# Patient Record
Sex: Female | Born: 1980 | Race: White | Hispanic: No | Marital: Single | Smoking: Current every day smoker
Health system: Southern US, Community
[De-identification: ages and names within clinical notes are randomized; demographics above are authoritative.]

## PROBLEM LIST (undated history)

## (undated) DIAGNOSIS — F419 Anxiety disorder, unspecified: Secondary | ICD-10-CM

## (undated) DIAGNOSIS — T7840XA Allergy, unspecified, initial encounter: Secondary | ICD-10-CM

## (undated) DIAGNOSIS — R569 Unspecified convulsions: Secondary | ICD-10-CM

## (undated) DIAGNOSIS — F1910 Other psychoactive substance abuse, uncomplicated: Secondary | ICD-10-CM

## (undated) DIAGNOSIS — G8929 Other chronic pain: Secondary | ICD-10-CM

## (undated) DIAGNOSIS — N3010 Interstitial cystitis (chronic) without hematuria: Secondary | ICD-10-CM

## (undated) DIAGNOSIS — F329 Major depressive disorder, single episode, unspecified: Secondary | ICD-10-CM

## (undated) DIAGNOSIS — F32A Depression, unspecified: Secondary | ICD-10-CM

## (undated) HISTORY — PX: BLADDER SURGERY: SHX569

## (undated) HISTORY — DX: Other psychoactive substance abuse, uncomplicated: F19.10

## (undated) HISTORY — PX: OTHER SURGICAL HISTORY: SHX169

## (undated) HISTORY — DX: Depression, unspecified: F32.A

## (undated) HISTORY — DX: Allergy, unspecified, initial encounter: T78.40XA

## (undated) HISTORY — DX: Anxiety disorder, unspecified: F41.9

## (undated) HISTORY — PX: CHOLECYSTECTOMY: SHX55

## (undated) HISTORY — DX: Unspecified convulsions: R56.9

## (undated) HISTORY — PX: TONSILLECTOMY: SUR1361

## (undated) HISTORY — DX: Major depressive disorder, single episode, unspecified: F32.9

---
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
## 2000-03-31 ENCOUNTER — Other Ambulatory Visit: Admission: RE | Admit: 2000-03-31 | Discharge: 2000-03-31 | Payer: Self-pay | Admitting: Gynecology

## 2001-04-21 ENCOUNTER — Emergency Department (HOSPITAL_COMMUNITY): Admission: EM | Admit: 2001-04-21 | Discharge: 2001-04-21 | Payer: Self-pay | Admitting: *Deleted

## 2001-07-07 ENCOUNTER — Emergency Department (HOSPITAL_COMMUNITY): Admission: EM | Admit: 2001-07-07 | Discharge: 2001-07-07 | Payer: Self-pay | Admitting: Emergency Medicine

## 2001-07-07 ENCOUNTER — Encounter: Payer: Self-pay | Admitting: Emergency Medicine

## 2002-10-25 ENCOUNTER — Emergency Department (HOSPITAL_COMMUNITY): Admission: EM | Admit: 2002-10-25 | Discharge: 2002-10-26 | Payer: Self-pay | Admitting: Emergency Medicine

## 2002-11-28 ENCOUNTER — Encounter (INDEPENDENT_AMBULATORY_CARE_PROVIDER_SITE_OTHER): Payer: Self-pay

## 2002-11-28 ENCOUNTER — Ambulatory Visit (HOSPITAL_BASED_OUTPATIENT_CLINIC_OR_DEPARTMENT_OTHER): Admission: RE | Admit: 2002-11-28 | Discharge: 2002-11-28 | Payer: Self-pay | Admitting: Urology

## 2003-07-20 ENCOUNTER — Emergency Department (HOSPITAL_COMMUNITY): Admission: EM | Admit: 2003-07-20 | Discharge: 2003-07-20 | Payer: Self-pay | Admitting: Emergency Medicine

## 2003-10-13 ENCOUNTER — Emergency Department (HOSPITAL_COMMUNITY): Admission: EM | Admit: 2003-10-13 | Discharge: 2003-10-14 | Payer: Self-pay | Admitting: Emergency Medicine

## 2003-12-04 ENCOUNTER — Ambulatory Visit (HOSPITAL_BASED_OUTPATIENT_CLINIC_OR_DEPARTMENT_OTHER): Admission: RE | Admit: 2003-12-04 | Discharge: 2003-12-04 | Payer: Self-pay | Admitting: Urology

## 2003-12-04 ENCOUNTER — Ambulatory Visit (HOSPITAL_COMMUNITY): Admission: RE | Admit: 2003-12-04 | Discharge: 2003-12-04 | Payer: Self-pay | Admitting: Urology

## 2004-04-24 ENCOUNTER — Encounter: Admission: RE | Admit: 2004-04-24 | Discharge: 2004-04-24 | Payer: Self-pay | Admitting: Internal Medicine

## 2004-07-24 ENCOUNTER — Emergency Department (HOSPITAL_COMMUNITY): Admission: EM | Admit: 2004-07-24 | Discharge: 2004-07-24 | Payer: Self-pay | Admitting: Emergency Medicine

## 2004-08-05 ENCOUNTER — Ambulatory Visit (HOSPITAL_COMMUNITY): Admission: RE | Admit: 2004-08-05 | Discharge: 2004-08-05 | Payer: Self-pay | Admitting: Urology

## 2004-08-05 ENCOUNTER — Ambulatory Visit (HOSPITAL_BASED_OUTPATIENT_CLINIC_OR_DEPARTMENT_OTHER): Admission: RE | Admit: 2004-08-05 | Discharge: 2004-08-05 | Payer: Self-pay | Admitting: Urology

## 2004-08-23 ENCOUNTER — Emergency Department (HOSPITAL_COMMUNITY): Admission: EM | Admit: 2004-08-23 | Discharge: 2004-08-23 | Payer: Self-pay | Admitting: Emergency Medicine

## 2005-07-20 ENCOUNTER — Other Ambulatory Visit: Admission: RE | Admit: 2005-07-20 | Discharge: 2005-07-20 | Payer: Self-pay | Admitting: Obstetrics and Gynecology

## 2005-10-27 ENCOUNTER — Ambulatory Visit (HOSPITAL_BASED_OUTPATIENT_CLINIC_OR_DEPARTMENT_OTHER): Admission: RE | Admit: 2005-10-27 | Discharge: 2005-10-27 | Payer: Self-pay | Admitting: Urology

## 2006-01-19 ENCOUNTER — Other Ambulatory Visit: Admission: RE | Admit: 2006-01-19 | Discharge: 2006-01-19 | Payer: Self-pay | Admitting: Obstetrics and Gynecology

## 2006-03-12 ENCOUNTER — Ambulatory Visit (HOSPITAL_BASED_OUTPATIENT_CLINIC_OR_DEPARTMENT_OTHER): Admission: RE | Admit: 2006-03-12 | Discharge: 2006-03-12 | Payer: Self-pay | Admitting: Urology

## 2006-08-20 ENCOUNTER — Ambulatory Visit (HOSPITAL_COMMUNITY): Admission: RE | Admit: 2006-08-20 | Discharge: 2006-08-20 | Payer: Self-pay | Admitting: Urology

## 2006-09-09 ENCOUNTER — Other Ambulatory Visit: Admission: RE | Admit: 2006-09-09 | Discharge: 2006-09-09 | Payer: Self-pay | Admitting: Obstetrics and Gynecology

## 2007-07-29 ENCOUNTER — Ambulatory Visit (HOSPITAL_BASED_OUTPATIENT_CLINIC_OR_DEPARTMENT_OTHER): Admission: RE | Admit: 2007-07-29 | Discharge: 2007-07-29 | Payer: Self-pay | Admitting: Urology

## 2007-09-28 ENCOUNTER — Other Ambulatory Visit: Admission: RE | Admit: 2007-09-28 | Discharge: 2007-09-28 | Payer: Self-pay | Admitting: Obstetrics and Gynecology

## 2008-05-07 ENCOUNTER — Other Ambulatory Visit: Admission: RE | Admit: 2008-05-07 | Discharge: 2008-05-07 | Payer: Self-pay | Admitting: Obstetrics and Gynecology

## 2008-10-03 ENCOUNTER — Other Ambulatory Visit: Admission: RE | Admit: 2008-10-03 | Discharge: 2008-10-03 | Payer: Self-pay | Admitting: Obstetrics and Gynecology

## 2009-09-05 ENCOUNTER — Encounter: Payer: Self-pay | Admitting: Infectious Disease

## 2009-09-09 ENCOUNTER — Encounter: Payer: Self-pay | Admitting: Infectious Disease

## 2009-09-11 ENCOUNTER — Ambulatory Visit: Payer: Self-pay | Admitting: Infectious Disease

## 2009-09-11 DIAGNOSIS — Z9189 Other specified personal risk factors, not elsewhere classified: Secondary | ICD-10-CM

## 2009-09-11 DIAGNOSIS — L52 Erythema nodosum: Secondary | ICD-10-CM

## 2009-09-11 DIAGNOSIS — Z8619 Personal history of other infectious and parasitic diseases: Secondary | ICD-10-CM

## 2009-09-11 DIAGNOSIS — F411 Generalized anxiety disorder: Secondary | ICD-10-CM | POA: Insufficient documentation

## 2009-09-11 DIAGNOSIS — L03119 Cellulitis of unspecified part of limb: Secondary | ICD-10-CM

## 2009-09-11 DIAGNOSIS — D7589 Other specified diseases of blood and blood-forming organs: Secondary | ICD-10-CM | POA: Insufficient documentation

## 2009-09-11 DIAGNOSIS — R42 Dizziness and giddiness: Secondary | ICD-10-CM | POA: Insufficient documentation

## 2009-09-11 DIAGNOSIS — F17200 Nicotine dependence, unspecified, uncomplicated: Secondary | ICD-10-CM

## 2009-09-11 DIAGNOSIS — L02419 Cutaneous abscess of limb, unspecified: Secondary | ICD-10-CM

## 2009-09-11 LAB — CONVERTED CEMR LAB
ALT: 13 units/L (ref 0–35)
AST: 16 units/L (ref 0–37)
Albumin: 4.3 g/dL (ref 3.5–5.2)
Alkaline Phosphatase: 63 units/L (ref 39–117)
Angiotensin 1 Converting Enzyme: 32 units/L (ref 9–67)
BUN: 19 mg/dL (ref 6–23)
Basophils Absolute: 0 10*3/uL (ref 0.0–0.1)
Basophils Relative: 0 % (ref 0–1)
CO2: 21 meq/L (ref 19–32)
CRP: 0.5 mg/dL (ref <0.6)
Calcium: 9.3 mg/dL (ref 8.4–10.5)
Chloride: 110 meq/L (ref 96–112)
Creatinine, Ser: 0.65 mg/dL (ref 0.40–1.20)
Crypto Ag: NEGATIVE
Eosinophils Absolute: 0.3 10*3/uL (ref 0.0–0.7)
Eosinophils Relative: 3 % (ref 0–5)
Ferritin: 27 ng/mL (ref 10–291)
Glucose, Bld: 85 mg/dL (ref 70–99)
HCT: 39.2 % (ref 36.0–46.0)
HIV 1 RNA Quant: <48 copies/mL (ref <48)
HIV-1 RNA Quant, Log: <1.68 (ref <1.68)
Hemoglobin: 13.5 g/dL (ref 12.0–15.0)
Lymphocytes Relative: 26 % (ref 12–46)
Lymphs Abs: 2.4 10*3/uL (ref 0.7–4.0)
MCHC: 34.3 g/dL (ref 30.0–36.0)
MCV: 101.7 fL — ABNORMAL HIGH (ref >=78.0)
Monocytes Absolute: 0.6 10*3/uL (ref 0.1–1.0)
Monocytes Relative: 6 % (ref 3–12)
Neutro Abs: 6 10*3/uL (ref 1.7–7.7)
Neutrophils Relative %: 65 % (ref 43–77)
Platelets: 236 10*3/uL (ref 150–400)
Potassium: 4 meq/L (ref 3.5–5.3)
RBC: 3.86 M/uL — ABNORMAL LOW (ref 3.87–5.11)
RDW: 12.1 % (ref 11.5–15.5)
Sed Rate: 9 mm/hr (ref 0–22)
Sodium: 141 meq/L (ref 135–145)
Total Bilirubin: 0.3 mg/dL (ref 0.3–1.2)
Total Protein: 6.5 g/dL (ref 6.0–8.3)
WBC: 9.2 10*3/uL (ref 4.0–10.5)

## 2009-09-12 ENCOUNTER — Encounter: Payer: Self-pay | Admitting: Infectious Disease

## 2009-09-16 ENCOUNTER — Telehealth: Payer: Self-pay | Admitting: Infectious Disease

## 2009-09-16 DIAGNOSIS — D692 Other nonthrombocytopenic purpura: Secondary | ICD-10-CM | POA: Insufficient documentation

## 2009-09-19 ENCOUNTER — Other Ambulatory Visit: Admission: RE | Admit: 2009-09-19 | Discharge: 2009-09-19 | Payer: Self-pay | Admitting: Obstetrics and Gynecology

## 2009-09-24 ENCOUNTER — Encounter: Payer: Self-pay | Admitting: Infectious Disease

## 2010-08-05 NOTE — Progress Notes (Signed)
Summary: lab results request   Phone Note Call from Patient   Summary of Call: Calling for lab results.     Pt states her leg is worse.  Please advise. Tomasita Morrow RN  September 16, 2009 1:35 PM   Follow-up for Phone Call        I told her that it would take some time to get ALL of her labs back. EVery single lab I have back so fare is completely normal except for her having a mild macrocytosis on her cbc. I think a punch biopsy of her shin lesion, dermatology referral, or rheumatology referral are some reasonable options. Finally I could give her a prednisone course but I am interested at finding out what she shas if possible first. 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 She really nees to be seen by Rheumatoloigst and I have put in referral for one. [facility] with Azzie Roup and Alben Deeds are a good group. Dr. Kellie Simmering also sees folks for Rheumatology

## 2010-08-05 NOTE — Letter (Signed)
Summary: [facility]  [facility]   Imported By: Margie Billet 09/20/2009 15:16:54  _____________________________________________________________________  External Attachment:    Type:   Image     Comment:   External Document

## 2010-08-05 NOTE — Miscellaneous (Signed)
Summary: HIPAA Restrictions  HIPAA Restrictions   Imported By: Florinda Marker 09/12/2009 14:06:13  _____________________________________________________________________  External Attachment:    Type:   Image     Comment:   External Document

## 2010-08-05 NOTE — Consult Note (Signed)
Summary: [facility]  [facility]   Imported By: Florinda Marker 10/07/2009 16:13:49  _____________________________________________________________________  External Attachment:    Type:   Image     Comment:   External Document

## 2010-08-05 NOTE — Assessment & Plan Note (Signed)
Summary: new pt fever x 3 wks   Visit Type:  Consult Referring Provider:  Althea Charon Primary Provider:  None  CC:  New referral Dr Althea Charon    left lower leg infection with c/o    headaches and nausea and bodyaches X 1 week.  History of Present Illness: 30 year old Caucasian lady with pMHx signficant for interstial cystitis who was evalauted by Dr. Althea Charon on 3/311 for painful nodules that  had devloped on left lower shin. This first developed these She had symptoms of GI upset with fever two weeks prior to being seen by Dr. Althea Charon. She had labs drawn on the 3rd which included an ASO titer that was elevated to 795. Lyme titer was checked (not clear why since this is not an endemic area)Her ESR and CRP were normal. Her other labs were significant for macrocytosis. She was started on Penicillin VK 500mg  three times daily along with indomethacin. SHe was called on Friday and appears to have misunderstood Dr. Georgeanne Nim office who were trying to convey that her ASO titer was positive. HOwever what came across to her was that she had a "blood stream infection." Over the weekend she developed headaches, increasing anxiety, some nausea and had a measured temperature of 101 along with diffuse joint pains. She denies any history of pharyngitis in the preceding weeks and months or of cellulitis other than the e nodosum like leison on her ankle. She denies having many other fevers and has not lost weight. 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  Current Medications (verified): 1)  Penicillin V Potassium 500 Mg Tabs (Penicillin V Potassium) .... One Three Times A Day 2)  Indomethacin Cr 75 Mg Cr-Caps (Indomethacin) .... On Twice Daily 3)  Depo-Provera 150 Mg/ml Susp (Medroxyprogesterone Acetate) .... Once Every 3 Months 4)  Excedrin Migraine 250-250-65 Mg Tabs (Aspirin-Acetaminophen-Caffeine) .... Takes As Needed Migraines  Allergies (verified): 1)  !  * Tequin   Preventive Screening-Counseling & Management  Alcohol-Tobacco     Alcohol drinks/day: 0     Smoking Status: current     Packs/Day: 1.0  Caffeine-Diet-Exercise     Caffeine use/day: 0      Drug Use:  no.     Current Allergies (reviewed today): ! * TEQUIN Past History:  Past Medical History: Interstitial cystitis smoking  Past Surgical History: none  Family History: diabetes, chf, HTN  Social History: Occupation: Hydrologist at [employer] Current Smoker Alcohol use-yes Drug use-no Has only travelled to [facility] as far as travel outside of [place] Drug Use:  no  Review of Systems       The patient complains of fever, headaches, abdominal pain, and suspicious skin lesions.  The patient denies anorexia, weight loss, weight gain, vision loss, decreased hearing, hoarseness, chest pain, syncope, dyspnea on exertion, peripheral edema, prolonged cough, hemoptysis, melena, hematochezia, severe indigestion/heartburn, hematuria, incontinence, genital sores, muscle weakness, difficulty walking, depression, unusual weight change, abnormal bleeding, and enlarged lymph nodes.    Vital Signs:  Patient profile:   30 year old female Height:      62 inches Weight:      126.5 pounds BMI:     23.22 BSA:     1.57 Temp:     98.3 degrees F Pulse rate:   94 / minute BP sitting:   120 / 72  (right arm)  Vitals Entered By: Tomasita Morrow RN (September 11, 2009 2:40 PM)  Serial Vital Signs/Assessments:  Time      Position  BP       Pulse  Resp  Temp     By 3;25 pm             114/70   75                    Tomasita Morrow RN 3:34 PM             116/75   86                    Tammy King RN  CC: New referral Dr Althea Charon    left lower leg infection with c/o    headaches, nausea and bodyaches X 1 week Is Patient Diabetic? No Nutritional Status BMI of 19 -24 = normal Nutritional Status Detail normal  Have you ever been in a relationship where you felt threatened, hurt  or afraid?No  Domestic Violence Intervention none  Does patient need assistance? Functional Status Self care Ambulation Normal   Physical Exam  General:  alert, well-nourished, and well-hydrated.   Head:  normocephalic, atraumatic, and no abnormalities observed.   Eyes:  vision grossly intact, pupils equal, pupils round, pupils reactive to light, and no injection.   Ears:  no external deformities and ear piercing(s) noted.   Nose:  no external deformity and no external erythema.   Mouth:  good dentition, pharynx pink and moist, no erythema, and no exudates.   Neck:  supple and full ROM.   Lungs:  normal respiratory effort, no accessory muscle use, normal breath sounds, no crackles, and no wheezes.   Heart:  normal rate, regular rhythm, no murmur, no gallop, and no rub.   Abdomen:  soft, non-tender, normal bowel sounds, no distention, and no masses.   Msk:  normal ROM and no joint tenderness.   Extremities:  she has faintly erythemaotous raised area on her ankle that is exquisitely tender to palpation Neurologic:  alert & oriented X3, strength normal in all extremities, and gait normal.   Skin:  see extremities Psych:  Oriented X3, memory intact for recent and remote, normally interactive, and good eye contact.     Impression & Recommendations:  Problem # 1:  ERYTHEMA NODOSUM (ICD-695.2) I think she may indeed benefit from a biopsy. I dont think this is a sequlae of antecedent group a strep infection (she has no clinical evidence for this). I am not going to react to the isolated elevated ASO absent any evidence for Rheumatic fever, GNehritis etc. I will check ACE level, HIV ab, RNA, RPR, histoplasma ag, crypto ag, blasto abs, ACE, ANCA, ASCA, ferritin. I would recommend she also be seen by Rheumatology and Dermotology. She can finish out her course of PCN but I dont think she has evidence for poststreptococcal infection and I dont think her e nodosum is soft tissue infection but am ok  with her finishing this course of abx. Orders: T-Pan-ANCA (3167) T- * Misc. Laboratory test [id number]) T- * Misc. Laboratory test [phone]) T- * Misc. Laboratory test [phone]) T-Syphilis Test (RPR) [phone]) T- * Misc. Laboratory test [phone]) T- * Misc. Laboratory test [phone]) T- * Misc. Laboratory test [phone]) T-HIV Viral Load [phone]) T-Ferritin [phone]) Consultation Level IV (66063)  Problem # 2:  PERSONAL HX OTH INFECTIOUS&PARASITIC DISEASE (ICD-V12.09) I actually DO NOT FIND EVIDENCE FOR antecedent streptococal infection. It is not uncommon for many adolescents and young adults  to ahve ASO titers that are elevated and the meaning of this tier in isolateion is not cear at all. Orders: Consultation Level IV (60454)  Problem # 3:  OTHER SPEC DISEASES BLOOD&BLOOD-FORMING ORGANS (ICD-289.89) She has macrocytosis. I will check folate and b12 and tsh Orders: Consultation Level IV (09811)  Problem # 4:  DIZZINESS (ICD-780.4) I think this is largely caused by the anxiety of her perception that she was being told that she had a "bloodstream infection." Her orthostatics were negative. Orders: Consultation Level IV (91478)  Problem # 5:  CELLULITIS AND ABSCESS OF LEG EXCEPT FOOT (ICD-682.6) I actually am sketpical of this representing cellulitis due to the time course (see above) Her updated medication list for this problem includes:    Penicillin V Potassium 500 Mg Tabs (Penicillin v potassium) ..... One three times a day    Excedrin Migraine 250-250-65 Mg Tabs (Aspirin-acetaminophen-caffeine) .Marland Kitchen... Takes as needed migraines  Orders: Consultation Level IV (29562)  Problem # 6:  SMOKER (ICD-305.1) could not pin down relationship between her E nodosum and smoking which migh be revealing if this was a harbinger of IBD Orders: Consultation Level IV (13086)  Medications Added to Medication List This Visit: 1)  Penicillin V Potassium 500 Mg Tabs (Penicillin v potassium) ....  One three times a day 2)  Depo-provera 150 Mg/ml Susp (Medroxyprogesterone acetate) .... Once every 3 months 3)  Excedrin Migraine 250-250-65 Mg Tabs (Aspirin-acetaminophen-caffeine) .... Takes as needed migraines  Other Orders: T-CBC w/Diff ([id number]) T-Comprehensive Metabolic Panel [phone]) T-C-Reactive Protein [phone]) T-Sed Rate (Automated) [phone]) T-Culture, Blood Routine ([id number]) T-Culture, Blood Routine ([id number]) T-HIV Antibody  (Reflex) ([id number])  Patient Instructions: 1)  Make followup appt with Dr. Daiva Eves in 2-3 weeks Process Orders Check Orders Results:     [facility]: Order checked:       NOT AUTHORIZED TO ORDER Tests Sent for requisitioning (September 11, 2009 7:29 PM):     09/11/2009: [facility] -- T-CBC w/Diff [[id number]] (signed)     09/11/2009: [facility] -- T-Comprehensive Metabolic Panel [[id number]] (signed)     09/11/2009: [facility] -- T-C-Reactive Protein [phone] (signed)     09/11/2009: [facility] -- T-Sed Rate (Automated) [[id number]] (signed)     09/11/2009: [facility] -- T-Culture, Blood Routine [[id number]] (signed)     09/11/2009: [facility] -- T-Culture, Blood Routine [[id number]] (signed)     09/11/2009: [facility] -- T-HIV Antibody  (Reflex) [[id number]] (signed)     09/11/2009: [facility] -- T-Pan-ANCA [3167] (signed)     09/11/2009: [facility] -- T- * Misc. Laboratory test [phone] (signed)     09/11/2009: [facility] -- T- * Misc. Laboratory test [phone] (signed)     09/11/2009: [facility] -- T- * Misc. Laboratory test [99999] (signed)     09/11/2009: [facility] -- T-Syphilis Test (RPR) [phone] (signed)     09/11/2009: [facility] -- T- * Misc. Laboratory test [phone]  (signed)     09/11/2009: [facility] -- T- * Misc. Laboratory test [phone] (signed)     09/11/2009: [facility] -- T- * Misc. Laboratory test [99999] (signed)     09/11/2009: [facility] -- T-HIV Viral Load [phone] (signed)     09/11/2009: [facility] -- T-Ferritin [phone] (signed)

## 2010-08-05 NOTE — Consult Note (Signed)
Summary: [facility]  [facility]   Imported By: Florinda Marker 09/24/2009 13:59:41  _____________________________________________________________________  External Attachment:    Type:   Image     Comment:   External Document

## 2010-10-07 ENCOUNTER — Other Ambulatory Visit: Payer: Self-pay | Admitting: Obstetrics and Gynecology

## 2010-10-07 ENCOUNTER — Other Ambulatory Visit (HOSPITAL_COMMUNITY)
Admission: RE | Admit: 2010-10-07 | Discharge: 2010-10-07 | Disposition: A | Discharge status: Home / Self Care | Payer: Managed Care, Other (non HMO) | Source: Ambulatory Visit | Attending: Obstetrics and Gynecology | Admitting: Obstetrics and Gynecology

## 2010-10-07 DIAGNOSIS — Z01419 Encounter for gynecological examination (general) (routine) without abnormal findings: Secondary | ICD-10-CM | POA: Insufficient documentation

## 2010-11-18 NOTE — Op Note (Signed)
Stacy Mejia, Stacy Mejia                 ACCOUNT NO.:  [id number]   MEDICAL RECORD NO.:  [id number]          PATIENT TYPE:  AMB   LOCATION:  [facility]                         FACILITY:  [facility]   PHYSICIAN:  Jamison Neighbor, M.D.  DATE OF BIRTH:  Oct 25, 1980   DATE OF PROCEDURE:  07/29/2007  DATE OF DISCHARGE:                               OPERATIVE REPORT   PREOPERATIVE DIAGNOSIS:  Interstitial cystitis.   POSTOPERATIVE DIAGNOSIS:  Interstitial cystitis.   PROCEDURE:  Cystoscopy, urethral calibration, hydrodistention of the  bladder, Marcaine and Pyridium instillation, Marcaine and Kenalog  injection.   SURGEON:  Jamison Neighbor, M.D.   ANESTHESIA:  General.   COMPLICATIONS:  None.   DRAINS:  None.   BRIEF HISTORY:  This 30 year old female has known interstitial cystitis.  She has requested repeat hydrodistention.  She is aware that there is no  guarantee she will comparable response to what she has had in the past.  It has been well over a year since her last hydrodistention.  This  seemed to be a reasonable request.  She gave full informed consent.   PROCEDURE:  After successful induction of general anesthesia, the  patient was placed in the dorsal position, prepped with Betadine, and  draped in the usual sterile fashion.   Careful bimanual examination revealed a normal bladder with no signs of  cystocele or prolapse of any kind.  There was no significant rectocele  or enterocele.  There was no discharge.  The uterus was palpably normal.  The patient's urethra was free of lesions, and there were no signs of  diverticulum.  The urethra was of normal caliber, accepting a 32-French  female urethral sound with no evidence of stenosis or stricture.  The  cystoscope was inserted.  The bladder was carefully inspected.  No  tumors or stones could be seen.  Both ureteral orifices were normal in  configuration and location.  Hydrodistention of the bladder was  performed.  The bladder was  distended at a pressure of 100 cm of water  for 5 minutes.  When the bladder was drained, glomerulations could be  seen throughout the bladder.  The patient had no evidence of Hunter's  ulcers.  There were no other irregularities seen.  The bladder capacity  of 900 mL is slightly low but well within normal range and certainly  suggests that she does not have worsening situation with her  interstitial cystitis.  The patient did not require biopsy.  The bladder  was drained.  A  mixture of Marcaine and Pyridium was left in the bladder. Marcaine and  Kenalog were injected periurethrally.  The patient received  intraoperative Toradol, Zofran, and a B&O suppository.  She was sent  home with Tylox, Pyridium Plus, and doxycycline.  She was taken to the  recovery room in good condition.      Jamison Neighbor, M.D.  Electronically Signed     RJE/MEDQ  D:  07/29/2007  T:  07/29/2007  Job:  [id number]

## 2010-11-21 NOTE — Op Note (Signed)
Stacy Mejia, Stacy Mejia                 ACCOUNT NO.:  [id number]   MEDICAL RECORD NO.:  [id number]          PATIENT TYPE:  AMB   LOCATION:  DAY                          FACILITY:  [facility]   PHYSICIAN:  Jamison Neighbor, M.D.  DATE OF BIRTH:  1981-01-26   DATE OF PROCEDURE:  08/20/2006  DATE OF DISCHARGE:                               OPERATIVE REPORT   PREOPERATIVE DIAGNOSIS:  Interstitial cystitis.   POSTOPERATIVE DIAGNOSIS:  Interstitial cystitis.   PROCEDURE:  Cystoscopy, urethral calibration, hydrodistention of  bladder, Marcaine and Pyridium installation, Marcaine and Kenalog  injection.   SURGEON:  Jamison Neighbor, M.D.   ANESTHESIA:  General.   COMPLICATIONS:  None.   DRAINS:  None.   BRIEF HISTORY:  This 30 year old female is known to have interstitial  cystitis.  She had a cystoscopy and hydrodistention done 6 months ago  and that worked quite nice for her for almost five months.  The patient  has had worsening symptoms just recently and is now to the point where  her weight is dropping because she cannot tolerate food stuff due to  increased pain.  She has requested repeat hydrodistention be performed.  The patient does understand that there is no guarantee that repeat  hydrodistention will work, if it does not work for her we will  discontinue this as a treatment option.  We are going to see if this  will help her.  We will continue her on her same medications and will  also consider the addition of instillation therapy.  The patient  understands the risks and benefits of the procedure and gave full  informed consent.   PROCEDURE:  After successful induction of general anesthesia, the  patient was placed in the dorsal lithotomy position, prepped with  Betadine and draped in the usual sterile fashion.  Cystoscopy was  reformed.  The urethra was found to be normal.  It was calibrated up to  32-French with female urethral sounds with no signs of stenosis or  stricture.   The bladder was carefully inspected.  No tumors or stones  could be seen.  The right ureteral orifice was normal configuration and  location.  On the left-hand side the patient is noted to have a  duplicated system but was otherwise unremarkable.  Hydrodistention was  performed at a pressure of 100 cm of water for 5 minutes.  When the  bladder was drained,  glomerulations could be seen throughout  the  bladder.  Bladder capacity was 1000 mL which is nearly normal and is  much improved over what she has had previously.  The patient did not  require biopsy.  A mixture of Marcaine and Pyridium was left in the  bladder.  Marcaine and Kenalog were injected periurethrally.  The  patient tolerated the procedure well and was taken to the recovery room  in good condition.   She will continue on her current medication list, was which includes  Elmiron, trazodone, Xanax, hydrocodone and Depo-Provera and will be  given doxycycline for postoperative antibiotic coverage as well as  Pyridium Plus to take  as needed.  We try to get her to get back on her Atarax which she had  discontinued, but is certainly going to be necessary in the upcoming  weeks as spring allergy season starts.  She will be encouraged to get  back on instillation therapy as well.           ______________________________  Jamison Neighbor, M.D.  Electronically Signed     RJE/MEDQ  D:  08/20/2006  T:  08/20/2006  Job:  [id number]

## 2010-11-21 NOTE — Op Note (Signed)
Stacy Mejia, Stacy Mejia                 ACCOUNT NO.:  [id number]   MEDICAL RECORD NO.:  [id number]          PATIENT TYPE:  AMB   LOCATION:  [facility]                         FACILITY:  [facility]   PHYSICIAN:  Jamison Neighbor, M.D.  DATE OF BIRTH:  09/15/1980   DATE OF PROCEDURE:  08/05/2004  DATE OF DISCHARGE:                                 OPERATIVE REPORT   PREOPERATIVE DIAGNOSIS:  Interstitial cystitis.   POSTOPERATIVE DIAGNOSIS:  Interstitial cystitis.   PROCEDURE:  Cystoscopy, urethral calibration, hydrodistension of the  bladder, Marcaine and Pyridium instillation, Marcaine and Kenalog injections   SURGEON:  Jamison Neighbor, M.D.   ANESTHESIA:  General.   COMPLICATIONS:  None.   DRAINS:  None.   INDICATIONS FOR PROCEDURE:  This 30 year old female has had problems with  chronic pelvic pain.  She has requested a repeat hydrodistension be  performed, as her symptoms are not well-controlled at this time.  The  patient has been on a combination of medications which consists of Elmiron,  Topamax, as well as pain medications.  She has also taken Zyrtec in the  past.  The patient does not feel that she is well-controlled at this time,  and she requested that a hydrodistension be performed since she has not  responded to instillation therapy.  She understands the risks and benefits  of the procedure and gave full informed consent.  She is particularly aware  of the fact that there is no guarantee that her symptoms will change with  this hydrodistension and that we may have to look at more aggressive  alternative forms of therapy.  She gave full informed consent.   DESCRIPTION OF PROCEDURE:  After successful induction of general anesthesia,  the patient was placed in the dorsal lithotomy position, prepped with  Betadine, and draped in the usual sterile fashion.   Careful bimanual examination showed no signs of cystocele, rectocele,  enterocele, or any masses associated with the urethra.   The urethra was of  normal caliber, accepting a 51 [place] female urethral sound with no sign of  stenosis or stricture.  The cystoscope was inserted.  The bladder was  carefully inspected.  It was free of any tumors or stones.  Both ureteral  orifices were of normal configuration and location.  The patient was then  distended at a pressure of 100 cmH20 for five minutes.  When the bladder was  drained, her bladder capacity is now up to normal at 1150 cc.  There were no  glomerulations and no bleeding seen at the end of the drain-out cycle.  This  would indicate significant improvement in her bladder due to the Elmiron  therapy.  The bladder was drained.  A mixture of Marcaine and Pyridium was  left in the bladder.  Marcaine and Kenalog were injected periurethrally.   She received intraoperative Toradol, Zofran, and a B&O suppository.  She  will be sent home with a prescription for Percocet to take as needed for  pain, doxycycline twice daily, and she has her other medications that she  will continue.  We will see  her back in followup in approximately three  weeks.      RJE/MEDQ  D:  08/05/2004  T:  08/05/2004  Job:  [id number]

## 2010-11-21 NOTE — Op Note (Signed)
Stacy Mejia, Stacy Mejia                             ACCOUNT NO.:  [id number]   MEDICAL RECORD NO.:  [id number]                   PATIENT TYPE:  AMB   LOCATION:  [facility]                                 FACILITY:  [facility]   PHYSICIAN:  Jamison Neighbor, M.D.               DATE OF BIRTH:  Apr 21, 1981   DATE OF PROCEDURE:  12/04/2003  DATE OF DISCHARGE:                                 OPERATIVE REPORT   PREOPERATIVE DIAGNOSIS:  Interstitial cystitis/painful bladder syndrome.   POSTOPERATIVE DIAGNOSIS:  Interstitial cystitis/painful bladder syndrome.   PROCEDURE:  1. Cystoscopy.  2. Urethral calibration.  3. __________.  4. Marcaine and Kenalog injection.   SURGEON:  Marcelyn Bruins, M.D.   ANESTHESIA:  General.   COMPLICATIONS:  None.   DRAINS:  None.   BRIEF HISTORY:  This 30 year old female is known to have interstitial  cystitis, this has been an improvement with hydrodistention.  The patient  has also done insulation therapy as well as oral therapy.  The patient has  been on a combination of Atarax, Zyrtec, amitriptyline and Elmiron which she  has tolerated, but she feels that her symptoms have reversed but she would  like to go ahead and get insulation therapy.  The patient is now to undergo  diagnostic cystoscopy and hydrodistention.  She understands the risks and  benefits of the procedure and gave full informed consent.   PROCEDURE:  After successful induction of general anesthesia, the patient  was placed in the dorsal lithotomy position, prepped with Betadine and  draped in the usual sterile fashion.  Careful bimanual examination revealed  no abnormalities of the urethra, specifically no signs of a diverticulum.  There was no evidence of urethral discharge or vaginal discharge.  There was  no cystocele, rectocele or enterocele detected.  The urethra was calibrated  to 46 [place] with female urethral sound.  The cystoscope was inserted, the  bladder was carefully inspected; it was  free of any tumor or stone.  Both  ureteral orifices were normal in configuration and location.  Hydrodistention of the bladder was then performed.  The patient was found to  have an essentially normal bladder capacity of 1000 mL.  This compares with  an average of 1150 and an average for interstitial cystitis of 575.  The  patient did, however, have glomerulations on the drain out cycle.  This  would suggest that the patient has an improvement in her bladder  appearance since the initiation of therapy.  A mixture of Marcaine and  __________ was left in the bladder.  Marcaine and Kenalog were injected  periurethrally.  The patient tolerated the procedure well and was taken to  the recovery room in good condition.  Jamison Neighbor, M.D.    RJE/MEDQ  D:  12/04/2003  T:  12/04/2003  Job:  [id number]   cc:   Sharlet Salina, M.D.  [address]  [place]  [place] [address]  Fax: [phone]

## 2010-11-21 NOTE — Op Note (Signed)
NAME:  Stacy Mejia, Stacy Mejia                          ACCOUNT NO.:  [id number]   MEDICAL RECORD NO.:  [id number]                   PATIENT TYPE:  AMB   LOCATION:  [facility]                                 FACILITY:  [facility]   PHYSICIAN:  Jamison Neighbor, M.D.               DATE OF BIRTH:  1981/01/24   DATE OF PROCEDURE:  11/28/2002  DATE OF DISCHARGE:                                 OPERATIVE REPORT   PREOPERATIVE DIAGNOSIS:  Interstitial cystitis.   POSTOPERATIVE DIAGNOSIS:  Interstitial cystitis.   PROCEDURE:  1. Cystoscopy.  2. Urethral calibration.  3. Water distention of the bladder.  4. Bladder biopsy.  5. Marcaine and ___________ instillation Marcaine and Kenalog as a pudendal     block.   SURGEON:  Jamison Neighbor, M.D.   ANESTHESIA:  General.   COMPLICATIONS:  None.   DRAINS:  None.   INDICATIONS FOR PROCEDURE:  This 30 year old female is carrying a diagnosis  of interstitial cystitis, but has never undergone cystoscopy and  hydrodistention. The patient had her diagnosis made by potassium testing.  She has been treated with instillation therapy and still does not feel that  she has had as much improvement as she would like. She is on a combination  of Atarax, Zyrtec, amitriptyline and Elmiron. We have gone ahead and done  the intravesical therapy and yet still  have not had much improvement. The  patient is now wanting to undergo diagnostic evaluation with hopeful  therapeutic benefit. She gave full and informed consent.   DESCRIPTION OF PROCEDURE:  After a successful induction of general  anesthesia, the patient was placed in the dorsal lithotomy position  and  prepped with Betadine and draped in the usual sterile fashion. Careful  bimanual examination  revealed  no abnormality of the urethra. There were no  signs of diverticula or suburethral mass. There was no real cystocele,  rectocele or enterocele.   The urethra was calibrated with a 77 [place] female urethral sounds  with no  signs of stenosis or stricture. The cystoscope was inserted. The bladder was  carefully inspected and was free of any tumor or stones. 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 The bladder was  extended to a pressure of 170 mm of water for 5 minutes and then the bladder  was drained. The bladder capacity was found to be almost normal at 1000 cc  with normal being approximately 1100. There was moderate glomerulations but  I expect less than what we have previously seen had it not been for Elmiron  therapy.   The patient had a bladder biopsy site which was cauterized. The bladder was  drained of a mixture of Marcaine and ________ was left in the bladder.  Marcaine and Kenalog were used to perform a pudendal block. The patient  tolerated the procedure well and was taken to the recovery room  in good  condition.                                                Jamison Neighbor, M.D.    RJE/MEDQ  D:  11/28/2002  T:  11/28/2002  Job:  [id number]

## 2010-11-21 NOTE — Op Note (Signed)
Stacy Mejia, Stacy Mejia                 ACCOUNT NO.:  [id number]   MEDICAL RECORD NO.:  [id number]          PATIENT TYPE:  AMB   LOCATION:  [facility]                         FACILITY:  [facility]   PHYSICIAN:  Jamison Neighbor, M.D.  DATE OF BIRTH:  [birth year]   DATE OF PROCEDURE:  03/12/2006  DATE OF DISCHARGE:                                 OPERATIVE REPORT   PREOPERATIVE DIAGNOSIS:  Interstitial cystitis.   POSTOPERATIVE DIAGNOSIS:  Interstitial cystitis.   PROCEDURE:  Cystoscopy, urethral calibration, hydrodistention of the  bladder, Marcaine and Pyridium instillation, Marcaine and Kenalog injection.   SURGEON:  Jamison Neighbor, M.D.   ANESTHESIA:  General.   COMPLICATIONS:  None.   DRAINS:  None.   BRIEF HISTORY:  This 30 year old female has previously undergone cystoscopy  and hydrodistention to diagnosis interstitial cystitis.  She had excellent  response, in terms of control of her symptoms, but now has had a return of  her pain.  The patient seems to be having significant problems with pain  with a weight loss now from 130 pounds down to 100 pounds.  She feels that  the reason she cannot keep food down is because she has so much pain.  It is  not due to any chronic nausea or GI problems.  The patient has requested  that she have a repeat hydrodistention be performed, and she is now to  undergo that procedure.  The patient understands the risks and benefits of  the procedure and gave full informed consent.   PROCEDURE:  After the successful induction of general anesthesia, the  patient is placed in a dorsal lithotomy position, prepped with Betadine, and  draped in the usual sterile fashion.  Careful bimanual examination revealed  no irregularities of the urethra. The patient had no signs of diverticulum.  There was no cystocele, rectocele, or enterocele.  There were no masses on  bimanual exam.  The cystoscope was inserted.  The bladder was carefully  inspected.  It was free of any  tumor or stones.  Both ureteral orifices were  normal in configuration and location.  The patient then underwent  hydrodistention.  The bladder was distended at a pressure of 100 cm of water  for five minutes.  When the bladder was drained, no glomerulations could be  seen.  This is a major improvement in the  bladder and will be felt to be due to her Elmiron-based therapy.  The  patient had a bladder capacity of 10,050 cc, which is in essence normal.  The patient tolerated the procedure well.  She was taken to the recovery  room in good condition.           ______________________________  Jamison Neighbor, M.D.  Electronically Signed     RJE/MEDQ  D:  03/12/2006  T:  03/12/2006  Job:  [id number]

## 2010-11-21 NOTE — Op Note (Signed)
Stacy Mejia, Stacy Mejia                 ACCOUNT NO.:  [id number]   MEDICAL RECORD NO.:  [id number]          PATIENT TYPE:  AMB   LOCATION:  [facility]                         FACILITY:  [facility]   PHYSICIAN:  Jamison Neighbor, M.D.  DATE OF BIRTH:  Jun 12, 1981   DATE OF PROCEDURE:  03/12/2006  DATE OF DISCHARGE:  03/12/2006                                 OPERATIVE REPORT   ADDENDUM:  At the end of the procedure the patient received and injection of  Marcaine and Pyridium into the bladder; and Marcaine and Kenalog were  injected into the periurethral spaces as a postoperative nerve block.           ______________________________  Jamison Neighbor, M.D.  Electronically Signed     RJE/MEDQ  D:  04/02/2006  T:  04/04/2006  Job:  [id number]

## 2010-11-21 NOTE — Op Note (Signed)
NAMEFRAYA, UEDA                 ACCOUNT NO.:  [id number]   MEDICAL RECORD NO.:  [id number]          PATIENT TYPE:  AMB   LOCATION:  [facility]                         [facility]ACILITY:  [facility]   PHYSICIAN:  Jamison Neighbor, M.D.  DATE OF BIRTH:  [birth year]   DATE OF PROCEDURE:  10/27/2005  DATE OF DISCHARGE:                                 OPERATIVE REPORT   PREOPERATIVE DIAGNOSIS:  Interstitial cystitis.   POSTOPERATIVE DIAGNOSIS:  Interstitial cystitis.   PROCEDURE:  Cystoscopy, urethral calibration, hydrodistention of the  bladder, Marcaine and Pyridium instillation, Marcaine and Kenalog injection.   SURGEON:  Jamison Neighbor, M.D.   ANESTHESIA:  General.   COMPLICATIONS:  None.   DRAINS:  None.   BRIEF HISTORY:  This 30 year old female is known to have interstitial  cystitis.  The patient has requested that a repeat hydrodistention be  performed.  She is aware of the fact that there is no guarantee she will  have comparable results to what she has had in the past and that she may not  have a decrease in her urgency, frequency, or pain.  The patient has been  taking her oral agents faithfully and is having a flare-up that does not  seem to be well-controlled.  She gave full informed consent for the  procedure.   PROCEDURE:  After the successful induction of general anesthesia, the  patient is placed in a dorsal lithotomy position, prepped with Betadine, and  draped in the usual sterile fashion.  Careful bimanual examination revealed  an unremarkable uterus with no palpable masses detected of any kind.  The  urethra was in normal position with no evidence of diverticulum or other  suburethral mass.  There was no cystocele, rectocele, or enterocele.  The  urethra was calibrated to a 4 [place] with female urethral sounds with no  evidence of stenosis or stricture.  The cystoscope was inserted.  The  bladder was carefully inspected.  It was free of any tumor or stones.  Both  ureteral  orifices were normal in configuration and location.  The mucosa had  a slightly pale appearance but was otherwise quite unremarkable.  Hydrodistention of the bladder was performed.  The bladder was distended at  a pressure of 100 cm of water for five minutes.  When the bladder was  drained, glomerulations could be were seen throughout the bladder,  consistent with an interstitial cystitis pattern, but we do note her bladder  capacity is nearly normal at about  1000 cc.  The patient was drained.  The patient did not require bladder  biopsy at this time.  A mixture of Marcaine and Pyridium was left in the  bladder.  A mixture of Marcaine and Kenalog were injected periurethrally as  a pudendal block.  The patient tolerated the procedure well and was taken to  the recovery room in good condition.           ______________________________  Jamison Neighbor, M.D.  Electronically Signed     RJE/MEDQ  D:  10/27/2005  T:  10/28/2005  Job:  [id number]

## 2011-03-26 LAB — POCT HEMOGLOBIN-HEMACUE: Operator id: 268271

## 2011-05-25 DIAGNOSIS — M6289 Other specified disorders of muscle: Secondary | ICD-10-CM | POA: Insufficient documentation

## 2011-05-25 DIAGNOSIS — G43709 Chronic migraine without aura, not intractable, without status migrainosus: Secondary | ICD-10-CM | POA: Insufficient documentation

## 2011-05-25 DIAGNOSIS — N9489 Other specified conditions associated with female genital organs and menstrual cycle: Secondary | ICD-10-CM | POA: Insufficient documentation

## 2011-08-12 DIAGNOSIS — N3010 Interstitial cystitis (chronic) without hematuria: Secondary | ICD-10-CM | POA: Insufficient documentation

## 2011-08-12 DIAGNOSIS — R300 Dysuria: Secondary | ICD-10-CM | POA: Insufficient documentation

## 2012-03-30 ENCOUNTER — Other Ambulatory Visit: Payer: Self-pay | Admitting: Obstetrics and Gynecology

## 2012-03-30 ENCOUNTER — Other Ambulatory Visit (HOSPITAL_COMMUNITY)
Admission: RE | Admit: 2012-03-30 | Discharge: 2012-03-30 | Disposition: A | Discharge status: Home / Self Care | Payer: Managed Care, Other (non HMO) | Source: Ambulatory Visit | Attending: Obstetrics and Gynecology | Admitting: Obstetrics and Gynecology

## 2012-03-30 DIAGNOSIS — Z01419 Encounter for gynecological examination (general) (routine) without abnormal findings: Secondary | ICD-10-CM | POA: Insufficient documentation

## 2012-06-14 ENCOUNTER — Encounter (HOSPITAL_COMMUNITY): Payer: Self-pay | Admitting: *Deleted

## 2012-06-14 ENCOUNTER — Emergency Department (HOSPITAL_COMMUNITY): Payer: Managed Care, Other (non HMO)

## 2012-06-14 ENCOUNTER — Emergency Department (HOSPITAL_COMMUNITY)
Admission: EM | Admit: 2012-06-14 | Discharge: 2012-06-14 | Disposition: A | Discharge status: Home / Self Care | Payer: Managed Care, Other (non HMO) | Attending: Emergency Medicine | Admitting: Emergency Medicine

## 2012-06-14 DIAGNOSIS — Z7982 Long term (current) use of aspirin: Secondary | ICD-10-CM | POA: Insufficient documentation

## 2012-06-14 DIAGNOSIS — M5412 Radiculopathy, cervical region: Secondary | ICD-10-CM | POA: Insufficient documentation

## 2012-06-14 DIAGNOSIS — Z79899 Other long term (current) drug therapy: Secondary | ICD-10-CM | POA: Insufficient documentation

## 2012-06-14 DIAGNOSIS — N3010 Interstitial cystitis (chronic) without hematuria: Secondary | ICD-10-CM | POA: Insufficient documentation

## 2012-06-14 DIAGNOSIS — F17200 Nicotine dependence, unspecified, uncomplicated: Secondary | ICD-10-CM | POA: Insufficient documentation

## 2012-06-14 HISTORY — DX: Interstitial cystitis (chronic) without hematuria: N30.10

## 2012-06-14 MED ORDER — METHOCARBAMOL 500 MG PO TABS: ORAL_TABLET | ORAL | Status: DC

## 2012-06-14 MED ORDER — NAPROXEN 500 MG PO TABS: 500.0000 mg | ORAL_TABLET | Freq: Two times a day (BID) | ORAL | Status: DC

## 2012-06-14 MED ORDER — HYDROCODONE-ACETAMINOPHEN 5-325 MG PO TABS: ORAL_TABLET | ORAL | Status: DC

## 2012-06-14 NOTE — ED Notes (Signed)
Pain lt upper arm for 4 days, No known injury.  Tender to palp. Good radial pulse.

## 2012-06-14 NOTE — ED Notes (Signed)
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

## 2012-06-16 NOTE — ED Provider Notes (Signed)
History     CSN: [id number]  Arrival date & time 06/14/12  1804   First MD Initiated Contact with Patient 06/14/12 1919      Chief Complaint  Patient presents with  . Arm Pain    (Consider location/radiation/quality/duration/timing/severity/associated sxs/prior treatment) HPI Comments: Patient c/o pain to her left shoulder and left neck for several days. Describes the pain as aching and occasionally radiates into her elbow.  Pain is worse with movement of the arm and rotation of her neck.  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  She denies numbness or weakness of the arm, chest pain, headaches, dizziness or swelling.  Patient is a 31 y.o. female presenting with arm pain. The history is provided by the patient.  Arm Pain This is a new problem. The current episode started in the past 7 days. The problem occurs constantly. The problem has been unchanged. Associated symptoms include arthralgias and neck pain. Pertinent negatives include no chest pain, chills, fever, headaches, joint swelling, nausea, numbness, rash, swollen glands, vomiting or weakness. The symptoms are aggravated by twisting (movement and palpation). She has tried acetaminophen for the symptoms. The treatment provided no relief.    Past Medical History  Diagnosis Date  . Interstitial cystitis     Past Surgical History  Procedure Date  . Cholecystectomy     No family history on file.  History  Substance Use Topics  . Smoking status: Current Every Day Smoker -- 1.0 packs/day    Types: Cigarettes  . Smokeless tobacco: Not on file  . Alcohol Use: No    OB History    Grav Para Term Preterm Abortions TAB SAB Ect Mult Living                  Review of Systems  Constitutional: Negative for fever and chills.  HENT: Positive for neck pain.   Respiratory: Negative for chest tightness.   Cardiovascular: Negative for chest pain.  Gastrointestinal: Negative for nausea and vomiting.    Genitourinary: Negative for dysuria and difficulty urinating.  Musculoskeletal: Positive for arthralgias. Negative for back pain and joint swelling.  Skin: Negative for color change, rash and wound.  Neurological: Negative for dizziness, syncope, weakness, numbness and headaches.  All other systems reviewed and are negative.    Allergies  Tequin and Adhesive  Home Medications   Current Outpatient Rx  Name  Route  Sig  Dispense  Refill  . ACETAMINOPHEN 500 MG PO TABS   Oral   Take 500-1,000 mg by mouth daily as needed. For pain         . AMITRIPTYLINE HCL 25 MG PO TABS   Oral   Take 25 mg by mouth at bedtime.         . ASPIRIN-ACETAMINOPHEN-CAFFEINE 250-250-65 MG PO TABS   Oral   Take 1 tablet by mouth every 6 (six) hours as needed. For pain         . DULOXETINE HCL 60 MG PO CPEP   Oral   Take 60 mg by mouth every morning.          Marland Kitchen HYDROCODONE-ACETAMINOPHEN 5-325 MG PO TABS   Oral   Take 1 tablet by mouth every 6 (six) hours as needed. For pain         . HYDROXYZINE HCL 25 MG PO TABS   Oral   Take 25-50 mg by mouth at bedtime.          Marland Kitchen  LIDOCAINE 5 % EX PTCH   Transdermal   Place 1 patch onto the skin daily. Remove & Discard patch within 12 hours or as directed by MD         . ADULT ONE DAILY GUMMIES PO CHEW   Oral   Chew 2-3 each by mouth daily.         Marland Kitchen NITROFURANTOIN MACROCRYSTAL 100 MG PO CAPS   Oral   Take 100 mg by mouth at bedtime.         Kathrynn Running ESTRAD-FE BIPHAS 1 MG-10 MCG / 10 MCG PO TABS   Oral   Take 1 tablet by mouth every morning.         Marland Kitchen OMEPRAZOLE 40 MG PO CPDR   Oral   Take 40 mg by mouth every morning.         Marland Kitchen PHENAZOPYRIDINE HCL 200 MG PO TABS   Oral   Take 400 mg by mouth at bedtime.          Marland Kitchen HYDROCODONE-ACETAMINOPHEN 5-325 MG PO TABS      Take one-two tabs po q 4-6 hrs prn pain   20 tablet   0   . METHOCARBAMOL 500 MG PO TABS      2 tablets by mouth 3 times a day prn muscle spasms    42 tablet   0   . NAPROXEN 500 MG PO TABS   Oral   Take 1 tablet (500 mg total) by mouth 2 (two) times daily with a meal.   20 tablet   0     BP 133/96  Pulse 95  Temp 98.2 F (36.8 C) (Oral)  Resp 18  Ht 5\' 2"  (1.575 m)  Wt 145 lb (65.772 kg)  BMI 26.52 kg/m2  SpO2 100%  LMP 06/14/2010  Physical Exam  Nursing note and vitals reviewed. Constitutional: She is oriented to person, place, and time. She appears well-developed and well-nourished. No distress.  HENT:  Head: Normocephalic and atraumatic.  Mouth/Throat: Oropharynx is clear and moist.  Eyes: EOM are normal. Pupils are equal, round, and reactive to light.  Neck: Normal range of motion and phonation normal. Neck supple. Muscular tenderness present. No spinous process tenderness present. No rigidity. No erythema present. No Brudzinski's sign and no Kernig's sign noted. No thyromegaly present.       ttp of the left cervical paraspinal muscles.  Grip strength is strong and equal bilaterally.  Sensation intact,  CR < 2 sec  Cardiovascular: Normal rate, regular rhythm, normal heart sounds and intact distal pulses.   No murmur heard. Pulmonary/Chest: Effort normal and breath sounds normal. No respiratory distress. She exhibits no tenderness.  Musculoskeletal: She exhibits tenderness. She exhibits no edema.       Left shoulder: She exhibits decreased range of motion, tenderness, pain and spasm. She exhibits no bony tenderness, no swelling, no effusion, no crepitus, no deformity, no laceration, normal pulse and normal strength.       Cervical back: She exhibits tenderness, pain and spasm. She exhibits normal range of motion, no bony tenderness, no swelling, no deformity and normal pulse.       Back:       ttp of the left posterior shoulder.  Pain with abduction of the left arm and rotation of the shoulder.  Radial pulse is brisk, sensation intact, CR< 2 sec.  No abrasions, edema or deformity of the joint.   Lymphadenopathy:     She has no cervical adenopathy.  Neurological: She is alert and oriented to person, place, and time. She has normal strength. No cranial nerve deficit or sensory deficit. She exhibits normal muscle tone. Coordination normal.  Reflex Scores:      Tricep reflexes are 2+ on the right side and 2+ on the left side.      Bicep reflexes are 2+ on the right side and 2+ on the left side. Skin: Skin is warm and dry.    ED Course  Procedures (including critical care time)  Labs Reviewed - No data to display Dg Cervical Spine Complete  06/14/2012  *RADIOLOGY REPORT*  Clinical Data: Arm pain.  Left arm pain and stiffness.  CERVICAL SPINE - COMPLETE 4+ VIEW  Comparison: None.  Findings: Loss of the normal cervical lordosis.  Prevertebral soft tissues are normal.  No cervical spine fracture, subluxation, or dislocation.  Cervicothoracic junction visualized and normal.  Odontoid intact.  IMPRESSION: No cervical spine osseous abnormality.  Mild reversal of the normal cervical lordosis probably positional or secondary to spasm.   Original Report Authenticated By: Andreas Newport, M.D.    Dg Shoulder Left  06/14/2012  *RADIOLOGY REPORT*  Clinical Data: Left shoulder and arm pain  LEFT SHOULDER - 2+ VIEW  Comparison: None  Findings: AC joint alignment normal. Bone mineralization normal. No glenohumeral fracture or dislocation. Visualized ribs intact.  IMPRESSION: Normal exam.   Original Report Authenticated By: Ulyses Southward, M.D.      1. Cervical radiculopathy       MDM    Patient has ttp of the left cervical paraspinal muscles.  No focal neuro deficits on exam.  Pain to left shoulder is reproduced with abduction of the left arm and rotation of the shoulder.  Doubt emergent neurological process.  Likely radiculopathy.  Pt agrees to close f/u   Prescribed: Robaxin norco #[id number] naprosyn      Kade Rickels L. [place], [place] 06/16/12 1743

## 2012-06-16 NOTE — ED Provider Notes (Signed)
Medical screening examination/treatment/procedure(s) were performed by non-physician practitioner and as supervising physician I was immediately available for consultation/collaboration.   Shelene Krage, MD 06/16/12 1819 

## 2013-02-24 ENCOUNTER — Emergency Department (INDEPENDENT_AMBULATORY_CARE_PROVIDER_SITE_OTHER)
Admission: EM | Admit: 2013-02-24 | Discharge: 2013-02-24 | Disposition: A | Discharge status: Home / Self Care | Payer: BC Managed Care – PPO | Source: Home / Self Care | Attending: Emergency Medicine | Admitting: Emergency Medicine

## 2013-02-24 ENCOUNTER — Emergency Department (INDEPENDENT_AMBULATORY_CARE_PROVIDER_SITE_OTHER): Payer: BC Managed Care – PPO

## 2013-02-24 ENCOUNTER — Encounter (HOSPITAL_COMMUNITY): Payer: Self-pay | Admitting: Emergency Medicine

## 2013-02-24 DIAGNOSIS — S82401A Unspecified fracture of shaft of right fibula, initial encounter for closed fracture: Secondary | ICD-10-CM

## 2013-02-24 DIAGNOSIS — S82409A Unspecified fracture of shaft of unspecified fibula, initial encounter for closed fracture: Secondary | ICD-10-CM

## 2013-02-24 MED ORDER — OXYCODONE-ACETAMINOPHEN 5-325 MG PO TABS: ORAL_TABLET | ORAL | Status: DC

## 2013-02-24 NOTE — ED Notes (Signed)
Waiting ortho tech 

## 2013-02-24 NOTE — ED Provider Notes (Signed)
Chief Complaint:   Chief Complaint  Patient presents with  . Ankle Injury    rolled right ankle x 2     History of Present Illness:   Stacy Mejia is a 32 year old female who rolled her ankle a week ago and then again last night. She heard a pop times. She had pain over the lateral malleolus, swelling, and bruising. She has a history of a fracture of her ankle. She has pain on movement of the ankle, mostly side to side with some front and back also. No numbness or tingling in the foot. She is able to bear some weight on her foot.  Review of Systems:  Other than noted above, the patient denies any of the following symptoms: Systemic:  No fevers, chills, sweats, or aches.   Musculoskeletal:  No joint pain, arthritis, bursitis, swelling, back pain, or neck pain. Neurological:  No muscular weakness or paresthesias.  PMFSH:  Past medical history, family history, social history, meds, and allergies were reviewed. She has a history of interstitial cystitis and takes Cymbalta, Prilosec, Loloestrin, hydroxyzine, and nitrofurantoin.  Physical Exam:   Vital signs:  BP 126/90  Pulse 94  Temp(Src) 97.3 F (36.3 C) (Oral)  Resp 20  SpO2 98% Gen:  Alert and oriented times 3.  In no distress. Musculoskeletal: Exam of the ankle reveals swelling and bruising below the lateral malleolus extending onto the dorsum of the foot. Anterior drawer sign was positive.  Talar tilt causes pain but no increase in tilt. Squeeze test was negative. Achilles tendon, peroneal tendon, and tibialis posterior were intact. Otherwise, all joints had a full a ROM with no swelling, bruising or deformity.  No edema, pulses full. Extremities were warm and pink.  Capillary refill was brisk.  Skin:  Clear, warm and dry.  No rash. Neuro:  Alert and oriented times 3.  Muscle strength was normal.  Sensation was intact to light touch.   Radiology:  Dg Ankle Complete Right  02/24/2013   *RADIOLOGY REPORT*  Clinical Data: Pain post  trauma  RIGHT ANKLE - COMPLETE 3+ VIEW  Comparison: None.  Findings: Frontal, oblique, lateral views were obtained.  There is mild swelling laterally.  There is a small avulsion arising from the lateral malleolus.  No other evidence of fracture. Ankle mortise appears intact. No appreciable joint effusion. No erosive change.  IMPRESSION: Small avulsion off lateral malleolus.  Swelling laterally.  Ankle mortise appears intact.   Original Report Authenticated By: Bretta Bang, M.D.   Dg Foot Complete Right  02/24/2013   *RADIOLOGY REPORT*  Clinical Data: Pain post twisted foot 1 week ago  RIGHT FOOT COMPLETE - 3+ VIEW  Comparison: None.  Findings: Three views of the right foot submitted.  No acute fracture or subluxation.  No radiopaque foreign body.  IMPRESSION: No acute fracture or subluxation.   Original Report Authenticated By: Natasha Mead, M.D.   I reviewed the images independently and personally and concur with the radiologist's findings.  Course in Urgent Care Center:    She was placed in a posterior and stirrup splint and given crutches.  Assessment:  The encounter diagnosis was Closed fibular fracture, right, initial encounter.  I am concerned about the positive drawer sign. She'll need followup with orthopedics next week.  Plan:   1.  The following meds were prescribed:   Discharge Medication List as of 02/24/2013  4:18 PM    START taking these medications   Details  oxyCODONE-acetaminophen (PERCOCET) 5-325 MG per tablet  1 to 2 tablets every 6 hours as needed for pain., Print       2.  The patient was instructed in symptomatic care, including rest and activity, elevation, application of ice and compression.  Appropriate handouts were given. 3.  The patient was told to return if becoming worse in any way, if no better in 3 or 4 days, and given some red flag symptoms such as increasing pain or any neurological symptoms that would indicate earlier return.   4.  The patient was told to  follow up with Dr. Jodi Geralds next week.    Reuben Likes, MD 02/24/13 [phone]

## 2013-02-24 NOTE — Progress Notes (Signed)
Orthopedic Tech Progress Note Patient Details:  Stacy Mejia 1980/09/22 [id number]  Ortho Devices Type of Ortho Device: Ace wrap;Post (short leg) splint;Stirrup splint Ortho Device/Splint Location: right leg Ortho Device/Splint Interventions: Application uc staff provided crutches  Nikki Dom 02/24/2013, 4:44 PM

## 2013-02-24 NOTE — ED Notes (Signed)
Reports rolling right ankle on Sunday and again last night. Pt has used ice and ace wrap with mild relief.  Pain with walking. Mild swelling.

## 2013-07-19 ENCOUNTER — Other Ambulatory Visit (HOSPITAL_COMMUNITY)
Admission: RE | Admit: 2013-07-19 | Discharge: 2013-07-19 | Disposition: A | Discharge status: Home / Self Care | Payer: BC Managed Care – PPO | Source: Ambulatory Visit | Attending: Obstetrics & Gynecology | Admitting: Obstetrics & Gynecology

## 2013-07-19 ENCOUNTER — Other Ambulatory Visit: Payer: Self-pay | Admitting: Obstetrics & Gynecology

## 2013-07-19 DIAGNOSIS — Z01419 Encounter for gynecological examination (general) (routine) without abnormal findings: Secondary | ICD-10-CM | POA: Insufficient documentation

## 2013-07-19 DIAGNOSIS — Z1151 Encounter for screening for human papillomavirus (HPV): Secondary | ICD-10-CM | POA: Insufficient documentation

## 2013-08-03 ENCOUNTER — Emergency Department (HOSPITAL_COMMUNITY)
Admission: EM | Admit: 2013-08-03 | Discharge: 2013-08-03 | Discharge status: Against Medical Advice | Payer: BC Managed Care – PPO | Attending: Emergency Medicine | Admitting: Emergency Medicine

## 2013-08-03 ENCOUNTER — Encounter (HOSPITAL_COMMUNITY): Payer: Self-pay | Admitting: Emergency Medicine

## 2013-08-03 DIAGNOSIS — Y9301 Activity, walking, marching and hiking: Secondary | ICD-10-CM | POA: Insufficient documentation

## 2013-08-03 DIAGNOSIS — S298XXA Other specified injuries of thorax, initial encounter: Secondary | ICD-10-CM | POA: Insufficient documentation

## 2013-08-03 DIAGNOSIS — IMO0002 Reserved for concepts with insufficient information to code with codable children: Secondary | ICD-10-CM | POA: Insufficient documentation

## 2013-08-03 DIAGNOSIS — Y9289 Other specified places as the place of occurrence of the external cause: Secondary | ICD-10-CM | POA: Insufficient documentation

## 2013-08-03 DIAGNOSIS — W1789XA Other fall from one level to another, initial encounter: Secondary | ICD-10-CM | POA: Insufficient documentation

## 2013-08-03 DIAGNOSIS — S0990XA Unspecified injury of head, initial encounter: Secondary | ICD-10-CM | POA: Insufficient documentation

## 2013-08-03 HISTORY — DX: Other chronic pain: G89.29

## 2013-08-03 NOTE — ED Notes (Signed)
Pt was walking on her driveway and fell down side of drive way   ? App 10 feet, No loc,  Alert MAE, Feels dizzy with blurry vision.    Pain bil ribs. Headache.  , and back pain.

## 2013-08-03 NOTE — ED Notes (Signed)
No answer, called x 3 

## 2013-08-03 NOTE — ED Notes (Signed)
Not in waiting room for room placement. 

## 2014-01-19 ENCOUNTER — Telehealth: Payer: Self-pay | Admitting: Family Medicine

## 2014-01-22 ENCOUNTER — Ambulatory Visit: Payer: Self-pay | Admitting: Family Medicine

## 2014-01-23 NOTE — Telephone Encounter (Signed)
Attempt to contact patient was unsuccessful.  

## 2014-01-26 ENCOUNTER — Telehealth: Payer: Self-pay | Admitting: Family Medicine

## 2014-01-26 NOTE — Telephone Encounter (Signed)
appt rescheduled.

## 2014-01-30 ENCOUNTER — Ambulatory Visit (INDEPENDENT_AMBULATORY_CARE_PROVIDER_SITE_OTHER): Payer: BC Managed Care – PPO | Admitting: Family Medicine

## 2014-01-30 ENCOUNTER — Encounter (INDEPENDENT_AMBULATORY_CARE_PROVIDER_SITE_OTHER): Payer: Self-pay

## 2014-01-30 ENCOUNTER — Encounter: Payer: Self-pay | Admitting: Family Medicine

## 2014-01-30 VITALS — BP 96/62 | HR 68 | Temp 98.6°F | Ht 62.0 in | Wt 146.0 lb

## 2014-01-30 DIAGNOSIS — R5381 Other malaise: Secondary | ICD-10-CM

## 2014-01-30 DIAGNOSIS — R5383 Other fatigue: Secondary | ICD-10-CM

## 2014-01-30 DIAGNOSIS — R531 Weakness: Secondary | ICD-10-CM

## 2014-01-30 MED ORDER — LORAZEPAM 0.5 MG PO TABS: 0.5000 mg | ORAL_TABLET | Freq: Two times a day (BID) | ORAL | Status: DC | PRN

## 2014-01-30 NOTE — Progress Notes (Signed)
   Subjective:    Patient ID: Stacy Mejia, female    DOB: [birth year], 33 y.o.   MRN: [id number]  HPI 33 year old female here to followup blood pressure. She had an apparent syncopal episode last week and was started on Ziac 10/625 as well as Ambien. Today she feels weak. She's not had any further syncope    Review of Systems  Constitutional: Positive for fatigue.  HENT: Negative.   Respiratory: Negative.   Cardiovascular: Negative.   Gastrointestinal: Negative.   Psychiatric/Behavioral: Positive for agitation. The patient is nervous/anxious.        Objective:   Physical Exam  Constitutional: She is oriented to person, place, and time. She appears well-developed and well-nourished.  Eyes: Conjunctivae and EOM are normal.  Neck: Normal range of motion. Neck supple.  Cardiovascular: Normal rate, regular rhythm and normal heart sounds.   Pulmonary/Chest: Effort normal and breath sounds normal.  Abdominal: Soft. Bowel sounds are normal.  Musculoskeletal: Normal range of motion.  Neurological: She is alert and oriented to person, place, and time. She has normal reflexes.  Skin: Skin is warm and dry.  Psychiatric: She has a normal mood and affect. Her behavior is normal. Thought content normal.          Assessment & Plan:  Weakness and fatigue are likely related to blood pressure. I believe she is on too high a dose of the Ziac have asked her to cut the tablets in half thereby making it 5/3.75. She is to monitor her blood pressure and call us with results. Also gave her some Ativan at her request to take in place of Ambien. 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

## 2014-03-27 ENCOUNTER — Encounter: Payer: Self-pay | Admitting: Neurology

## 2014-03-27 ENCOUNTER — Encounter (INDEPENDENT_AMBULATORY_CARE_PROVIDER_SITE_OTHER): Payer: Self-pay

## 2014-03-27 ENCOUNTER — Ambulatory Visit (INDEPENDENT_AMBULATORY_CARE_PROVIDER_SITE_OTHER): Payer: BC Managed Care – PPO | Admitting: Neurology

## 2014-03-27 VITALS — BP 132/92 | HR 78 | Resp 16 | Ht 63.0 in | Wt 142.0 lb

## 2014-03-27 DIAGNOSIS — R569 Unspecified convulsions: Secondary | ICD-10-CM

## 2014-03-27 MED ORDER — PHENYTOIN SODIUM EXTENDED 100 MG PO CAPS: 100.0000 mg | ORAL_CAPSULE | Freq: Every day | ORAL | Status: DC
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
## 2014-03-27 NOTE — Patient Instructions (Addendum)
Driving and Equipment Restrictions Some medical problems make it dangerous to drive, ride a bike, or use machines. Some of these problems are:  A hard blow to the head (concussion).  Passing out (fainting).  Twitching and shaking (seizures).  Low blood sugar.  Taking medicine to help you relax (sedatives).  Taking pain medicines.  Wearing an eye patch.  Wearing splints. This can make it hard to use parts of your body that you need to drive safely. HOME CARE   Do not drive until your doctor says it is okay.  Do not use machines until your doctor says it is okay. You may need a form signed by your doctor (medical release) before you can drive again. You may also need this form before you do other tasks where you need to be fully alert. MAKE SURE YOU:  Understand these instructions.  Will watch your condition.  Will get help right away if you are not doing well or get worse. Document Released: 07/30/2004 Document Revised: 09/14/2011 Document Reviewed: 10/30/2009 [facility] Patient Information 2015 [place], [place]. This information is not intended to replace advice given to you by your health care provider. Make sure you discuss any questions you have with your health care provider.   The patient will be out of work on medical leave for 5 more days including 29 Sept , she can resume working on the 30 th. September.

## 2014-03-29 ENCOUNTER — Telehealth: Payer: Self-pay | Admitting: *Deleted

## 2014-03-29 NOTE — Telephone Encounter (Signed)
Patient was called and informed that the referral will be sent to Dr. Karel Jarvis for a long term study to be done and will call back with referral details as soon as I can.

## 2014-03-29 NOTE — Telephone Encounter (Signed)
Message sent to Dr. [name].  Please advise.

## 2014-03-30 ENCOUNTER — Telehealth: Payer: Self-pay | Admitting: Neurology

## 2014-03-30 DIAGNOSIS — R569 Unspecified convulsions: Secondary | ICD-10-CM

## 2014-03-30 NOTE — Telephone Encounter (Signed)
Left a message for the patient that Vesta Mixer will be calling her to set up the extended EEG for her.

## 2014-03-30 NOTE — Telephone Encounter (Signed)
Patient will have a extended EEG done by [facility], the refill was faxed over.

## 2014-03-30 NOTE — Telephone Encounter (Signed)
I ordered a prolonged EEG monitoring, if this is normal throughout convulsions, will need to refer to counseling. CD

## 2014-04-02 ENCOUNTER — Other Ambulatory Visit: Payer: BC Managed Care – PPO | Admitting: Radiology

## 2014-04-02 NOTE — Telephone Encounter (Signed)
Pt called and is continuiing to have convulsions on daily basis since seen on 03-27-14.  Lasting about 1 minute, (these convulsions boyfriend states are twitching, eyes shut, comes out of it is confused.  She also falls (this after 1900, [facility], boyfriend states) this from 03-29-14.  She is taking dilantin  po qhs.  Has not heard about MRI or EEG.  I called and spoke to [facility] and gave them Forest's # as well as her cell #.  They had tried calling pt.  They will try her again today.  I gave pt their number as well.   Will contact Renee about MRI scheduling.

## 2014-04-03 NOTE — Telephone Encounter (Signed)
I called [facility].  Extended is for 1-1.5 hours in length.  FYI

## 2014-04-03 NOTE — Telephone Encounter (Signed)
Patient calling to state that she would like her EEG results before she goes back to work, please return call and advise.

## 2014-04-03 NOTE — Telephone Encounter (Signed)
I called pt and she is scheduled for EEG and MRI on the 04-04-14.  (mri at 1845 and the EEG at 1100).  Pt stated they told her that she would lie down for 40 min for the EEG. (?).  Will check with Dr. Vickey Hugerohmeier on the [facility].

## 2014-04-03 NOTE — Telephone Encounter (Addendum)
Stacy Mejia needs to get in contact with the EEG lab or allow them to get in contact with her . She can not go back to work until the tests are done and read. If [facility] cannot get access, will refer to Dr .Karel JarvisAquino for prolonged monitoring and care .   Please copy the following note to a letter head and mail to patient.    Sept 28 th, 2015   To whom it may concern,   Stacy Mejia is currently in the midst of a neurological work up. The patient is not safe to drive, or to return work at this time.  The work up should be concluded and completed by October 8th , 2015.   Stacy Lara, MD

## 2014-04-03 NOTE — Telephone Encounter (Signed)
I will be signing her out of work for the next 8 days.

## 2014-04-04 ENCOUNTER — Other Ambulatory Visit: Payer: BC Managed Care – PPO

## 2014-04-04 DIAGNOSIS — R404 Transient alteration of awareness: Secondary | ICD-10-CM

## 2014-04-04 DIAGNOSIS — R5600 Simple febrile convulsions: Secondary | ICD-10-CM

## 2014-04-04 NOTE — Telephone Encounter (Signed)
I printed out letter per Dr. Vickey Hugerohmeier and I called the patient to verify if she wanted it mailed or to pick it up.  Patient states that she will come in tomorrow to pick up the letter.  Patient is scheduled for her MRI tonight.

## 2014-04-05 ENCOUNTER — Telehealth: Payer: Self-pay | Admitting: *Deleted

## 2014-04-06 NOTE — Telephone Encounter (Signed)
  Dilantin works 24 hours, so this medication covers the seizure condition  all day.   I was unaware that the bladder stimulator cannot be shielded/ coiled for a Brain MRI. Sorry for that .

## 2014-04-10 NOTE — Telephone Encounter (Signed)
Patient calling requesting a sooner appointment, patient was scheduled for 04/17/14 at 2 pm with Dr Vickey Hugerohmeier. Patient also requesting for Andrey CampanileSandy to call her back, states that she has some questions she would like to ask.

## 2014-04-13 NOTE — Telephone Encounter (Signed)
I called and spoke to pt and relayed that the Dilantin 300mg  po qhs she is taking lasts 24 hours. I will call [facility] and see about EEG results.  [phone].   I spoke to [place] [facility].  They are to fax to us [phone].  Pt has appt 04-17-14.

## 2014-04-17 ENCOUNTER — Ambulatory Visit (INDEPENDENT_AMBULATORY_CARE_PROVIDER_SITE_OTHER): Payer: BC Managed Care – PPO | Admitting: Neurology

## 2014-04-17 ENCOUNTER — Encounter (INDEPENDENT_AMBULATORY_CARE_PROVIDER_SITE_OTHER): Payer: Self-pay

## 2014-04-17 ENCOUNTER — Encounter: Payer: Self-pay | Admitting: Neurology

## 2014-04-17 VITALS — BP 137/91 | HR 87 | Temp 99.2°F | Resp 14 | Ht 62.0 in | Wt 136.0 lb

## 2014-04-17 DIAGNOSIS — F411 Generalized anxiety disorder: Secondary | ICD-10-CM

## 2014-04-17 DIAGNOSIS — R6889 Other general symptoms and signs: Principal | ICD-10-CM

## 2014-04-17 DIAGNOSIS — R569 Unspecified convulsions: Secondary | ICD-10-CM

## 2014-04-17 DIAGNOSIS — F445 Conversion disorder with seizures or convulsions: Secondary | ICD-10-CM

## 2014-04-17 DIAGNOSIS — IMO0001 Reserved for inherently not codable concepts without codable children: Secondary | ICD-10-CM

## 2014-04-17 DIAGNOSIS — F449 Dissociative and conversion disorder, unspecified: Secondary | ICD-10-CM | POA: Insufficient documentation

## 2014-04-17 MED ORDER — DIAZEPAM 10 MG PO TABS: 10.0000 mg | ORAL_TABLET | Freq: Four times a day (QID) | ORAL | Status: DC | PRN

## 2014-04-17 NOTE — Patient Instructions (Signed)
Conversion Disorder °Conversion disorder is a type of somatoform disorder. A person with this disorder experiences symptoms that cannot be explained by medical findings. The symptoms are real. They are not faked or created. Symptoms may last for a few days or weeks. Often, a person with this disorder is not overly concerned about the severe symptoms. °CAUSES °It is unclear what causes conversion disorder. It may be related to: °· Recent psychological stress or conflict. °· History of medical illness. °· History of mental illness. °· Family history of somatoform disorder. °It is thought that the symptoms may be the result of avoiding an emotional conflict. Often psychological problems are relieved when the physical symptoms appear. The disorder usually occurs in people age 10 through 35. °SYMPTOMS °Symptoms can be sudden and severe. While the symptoms may not have a physical cause, they may result in discomfort or distress. °DIAGNOSIS °Diagnostic testing will vary depending on the specific symptoms involved. Evaluation may include: °· Physical exam. °· Screening health questionnaire. °· Medical tests, such as blood tests, urine tests, X-rays, or other imaging tests. °· Psychological exam. °Ask when your test results will be ready. Make sure you get your test results. °TREATMENT °There is no cure for conversion disorder, but the symptoms can be managed and sometimes prevented. Treatment aims at teaching coping skills, reducing stress, and preventing new episodes. Once the diagnosis of conversion disorder is made, treatment may include: °· Regular monitoring and consultation with a dedicated caregiver for evaluation and coping skills. °· Regular monitoring and therapy with a mental health provider to increase understanding of triggers and coping skills. Therapies may include: °¨ Talk therapy. °¨ Stress management training. °¨ Cognitive behavioral therapy. °¨ Hypnosis. °¨ Antidepressant medicines. °¨ Magnetic brain  stimulation. °· If paralysis or numbness is involved, physical therapy may be needed to maintain muscle strength. °· For other neurological symptoms, occupational therapy may be needed. °It can be helpful for a primary caregiver and mental health provider to work together to develop a treatment plan. °HOME CARE INSTRUCTIONS °· Follow up with your primary caregiver or mental health provider as directed. °· Follow up with physical or occupational therapists as directed. °· Take medicines as directed. °· Try to reduce stress. °SEEK MEDICAL CARE IF: °· Symptoms do not go away or become severe. °· New symptoms appear. °SEEK IMMEDIATE MEDICAL CARE IF: °Your symptoms are causing distress to the point that you are considering hurting yourself or others. °MAKE SURE YOU:  °· Understand these instructions. °· Will watch your condition. °· Will get help right away if you are not doing well or get worse. °Document Released: 07/25/2010 Document Revised: 09/14/2011 Document Reviewed: 07/25/2010 °[employer]® Patient Information ©2015 [employer]. This information is not intended to replace advice given to you by your health care provider. Make sure you discuss any questions you have with your health care provider. ° °
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
## 2014-04-18 ENCOUNTER — Telehealth: Payer: Self-pay | Admitting: *Deleted

## 2014-04-18 ENCOUNTER — Telehealth: Payer: Self-pay | Admitting: Neurology

## 2014-04-18 NOTE — Telephone Encounter (Signed)
Patient calling to state that when she seen Dr Vickey Hugerohmeier in the office yesterday, states that her pharmacy [facility] states that they never received the Rx. Patient request that [facility] [place]. Please see phone note for 04/18/14 from [facility]

## 2014-04-18 NOTE — Telephone Encounter (Signed)
Per OV notes:  Continue on Xanax for the bladder dysfunction. Not prescribed by neurology.   I called the pharmacy back.  Spoke with pharmacist.  Relayed this message, however, asked them to cancel the Rx, because patient is requesting [facility].

## 2014-04-18 NOTE — Telephone Encounter (Signed)
Per previous note, [facility] pharmacy does have the Rx.  I called the patient back.  She said she wants [facility].  [facility], and [facility] has [facility] to [facility] per patient request.

## 2014-04-18 NOTE — Telephone Encounter (Signed)
Devina Pharmacist with [facility] @ [phone], stated she received Rx request for diazepam (VALIUM) 10 MG tablet and patient gets ALPRAZolam Prudy Feeler(XANAX) 1 MG tablet from [place]. Marcelyn Bruinsobert [place].  Please call and advise.

## 2014-04-23 ENCOUNTER — Telehealth: Payer: Self-pay | Admitting: *Deleted

## 2014-04-23 NOTE — Telephone Encounter (Signed)
Patient would like to be called concerning outgoing referral for psychiatry.  Who will the appointment be made with?
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
## 2014-04-23 NOTE — Telephone Encounter (Signed)
LMVM for pt to return call.   I did LM for [facility], alternate #, with the names of Dr. Evelene CroonKaur or Dr. Nolen MuMcKinney of possible referrals.   Pt is to call back if needed.

## 2014-04-25 ENCOUNTER — Ambulatory Visit: Payer: BC Managed Care – PPO | Admitting: Adult Health

## 2014-04-25 ENCOUNTER — Encounter: Payer: Self-pay | Admitting: Neurology

## 2014-04-26 ENCOUNTER — Telehealth: Payer: Self-pay | Admitting: Neurology
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
## 2014-04-26 NOTE — Telephone Encounter (Signed)
Attempted to call number, not working #.

## 2014-04-27 NOTE — Telephone Encounter (Addendum)
I called her #  and could not LM, then called  cell , [facility] , and LMVM for pt to return call.

## 2014-04-30 ENCOUNTER — Other Ambulatory Visit: Payer: Self-pay | Admitting: *Deleted

## 2014-04-30 DIAGNOSIS — F411 Generalized anxiety disorder: Secondary | ICD-10-CM

## 2014-05-01 NOTE — Telephone Encounter (Signed)
Attempted to call pt.  VM stated to try again later.

## 2014-05-08 ENCOUNTER — Encounter: Payer: Self-pay | Admitting: *Deleted

## 2014-05-08 ENCOUNTER — Telehealth: Payer: Self-pay | Admitting: Neurology

## 2014-05-08 NOTE — Telephone Encounter (Signed)
Ruby from [facility] in [place] calling to state that she attempted to call patient to get her scheduled 3 times but patient's number says that she is not taking calls. Ruby just wanted to make us aware.

## 2014-05-08 NOTE — Telephone Encounter (Signed)
[facility] and let her know that I have not heard from her as well, after leaving several messages on her and her boyfriend's #.  I sent her letter with information to call Ruby for appt.

## 2014-05-14 ENCOUNTER — Telehealth: Payer: Self-pay | Admitting: Neurology

## 2014-05-14 NOTE — Telephone Encounter (Signed)
Patient calling to request that she get one more Valium refill, please return call and advise.

## 2014-05-14 NOTE — Telephone Encounter (Signed)
Patient was given a Rx for Valium at last OV.  She called requesting one additional refill on this med.  Would you like to refill?  Please advise.  Thank you.

## 2014-06-15 ENCOUNTER — Encounter (HOSPITAL_COMMUNITY): Payer: Self-pay | Admitting: Psychiatry

## 2014-06-15 ENCOUNTER — Ambulatory Visit (INDEPENDENT_AMBULATORY_CARE_PROVIDER_SITE_OTHER): Payer: BC Managed Care – PPO | Admitting: Psychiatry

## 2014-06-15 VITALS — BP 140/92 | Ht 62.0 in | Wt 133.0 lb

## 2014-06-15 DIAGNOSIS — F4310 Post-traumatic stress disorder, unspecified: Secondary | ICD-10-CM | POA: Insufficient documentation

## 2014-06-15 MED ORDER — DIAZEPAM 10 MG PO TABS: 10.0000 mg | ORAL_TABLET | Freq: Every day | ORAL | Status: DC

## 2014-06-15 MED ORDER — SERTRALINE HCL 50 MG PO TABS: 50.0000 mg | ORAL_TABLET | Freq: Every day | ORAL | Status: DC
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
## 2014-06-16 LAB — DRUGS OF ABUSE SCREEN W/O ALC, ROUTINE URINE
Amphetamine Screen, Ur: NEGATIVE
BARBITURATE QUANT UR: NEGATIVE
Benzodiazepines.: NEGATIVE
COCAINE METABOLITES: NEGATIVE
Creatinine,U: 59.4 mg/dL
Marijuana Metabolite: NEGATIVE
Methadone: NEGATIVE
Opiate Screen, Urine: NEGATIVE
Phencyclidine (PCP): NEGATIVE
Propoxyphene: NEGATIVE

## 2014-06-19 ENCOUNTER — Telehealth (HOSPITAL_COMMUNITY): Payer: Self-pay | Admitting: *Deleted

## 2014-07-02 ENCOUNTER — Telehealth (HOSPITAL_COMMUNITY): Payer: Self-pay | Admitting: *Deleted

## 2014-07-02 NOTE — Telephone Encounter (Signed)
Phone call from patient  who stated that due to a bladder disease, Dr. Logan BoresEvans put her on Xanax.   Pharmacy is having problem filling Xanax and Diazepam.  She is willing to come off Diazepam.  she stated she needs her Xanax and can't wait for a week.

## 2014-07-04 ENCOUNTER — Telehealth (HOSPITAL_COMMUNITY): Payer: Self-pay | Admitting: *Deleted

## 2014-07-04 NOTE — Telephone Encounter (Signed)
Pt called stating she would like Dr. Tenny Crawoss to take her off of Valium and put her on something else. Per pt the pharmacy will not fill her Xanax due to her being on Valium. Asked pt if Dr. Tenny Crawoss knew she was taking Xanax and she stated yes and Dr. Tenny Crawoss put her on Valium. Per pt, she have not had her Xanax since Dec. 26, 2015 and she really need it. Called pt pharmacy ([facility]) and they stated pt last picked up her her Valium 06-15-14 and last picked up her Xanax 06-01-14. Pharmacy then stated pt picked up her Ambien 06-20-14 and Norco 06-24-14. Called pt and she stated she d/c her Ambien about 1 month ago and did confirmed picking up her other medications the same time. Pt would like to have Dr. Tenny Crawoss fill in doctor call Dr. Logan BoresEvans letting them know that she have stopped her Valium and put on something else so she can get refills for her Xanax. Inform pt that Dr. Tenny Crawoss is out of the office until Jul 10, 2013 but I will talk to her fill in doctor about this.

## 2014-07-05 ENCOUNTER — Telehealth (HOSPITAL_COMMUNITY): Payer: Self-pay | Admitting: *Deleted

## 2014-07-05 NOTE — Telephone Encounter (Signed)
Phone call from patient, canceled appointment for 07/17/14 stated she has an appointment with another doctor on Tuesday.   Also to tell Dr. Charlott Rakesoss's assistant not to worry about the request for meds.

## 2014-07-10 DIAGNOSIS — IMO0001 Reserved for inherently not codable concepts without codable children: Secondary | ICD-10-CM | POA: Insufficient documentation

## 2014-07-10 DIAGNOSIS — Z79891 Long term (current) use of opiate analgesic: Secondary | ICD-10-CM | POA: Insufficient documentation

## 2014-07-10 DIAGNOSIS — Z79899 Other long term (current) drug therapy: Secondary | ICD-10-CM | POA: Insufficient documentation

## 2014-07-10 DIAGNOSIS — R3915 Urgency of urination: Secondary | ICD-10-CM | POA: Insufficient documentation

## 2014-07-10 DIAGNOSIS — F32A Depression, unspecified: Secondary | ICD-10-CM | POA: Insufficient documentation

## 2014-07-10 DIAGNOSIS — F329 Major depressive disorder, single episode, unspecified: Secondary | ICD-10-CM | POA: Insufficient documentation

## 2014-07-17 ENCOUNTER — Ambulatory Visit (HOSPITAL_COMMUNITY): Payer: Self-pay | Admitting: Psychiatry

## 2014-07-23 NOTE — Telephone Encounter (Signed)
noted 

## 2014-09-08 ENCOUNTER — Other Ambulatory Visit (HOSPITAL_COMMUNITY): Payer: Self-pay | Admitting: Psychiatry

## 2014-09-18 ENCOUNTER — Telehealth (HOSPITAL_COMMUNITY): Payer: Self-pay | Admitting: *Deleted

## 2014-09-18 NOTE — Telephone Encounter (Signed)
Called pt to schedule f/u appt due to pt only being seen once and pharmacy requesting refills for her Zoloft. Per pt she will no longer be coming to practice to see Dr. Tenny Crawoss anymore. Per pt she is going to see another doctor and did not want to sch with Dr. Tenny Crawoss. Informed pt that she need to let her pharmacy know of this change but pt seemed confused so I informed pt that I will call her pharmacy to inform them of this change. Pt agreed.

## 2014-09-19 NOTE — Telephone Encounter (Signed)
noted 

## 2014-09-28 IMAGING — CR DG FOOT COMPLETE 3+V*R*
3 series · 3 of 3 positions shown · non-contrast
Comparison: None.

CLINICAL DATA: Pain post twisted foot 1 week ago

RIGHT FOOT COMPLETE - 3+ VIEW

[view not recorded (1 of 3)]
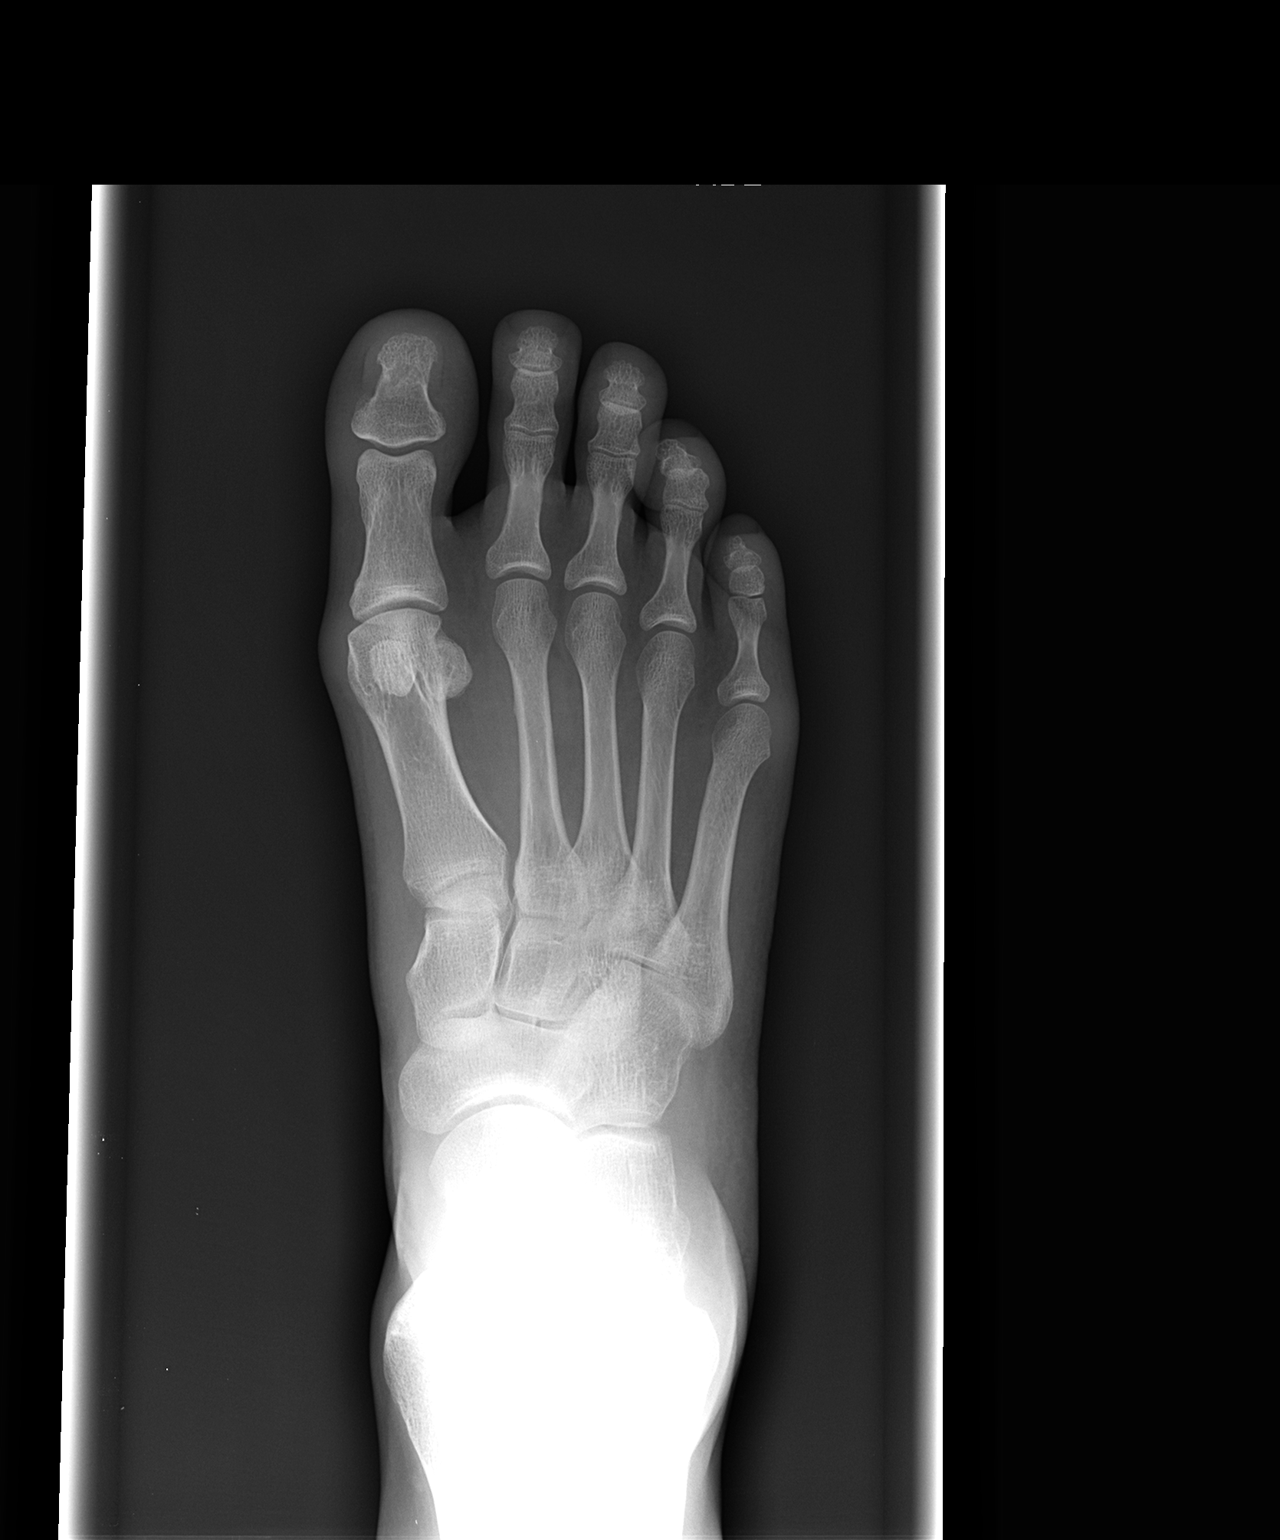

[view not recorded (2 of 3)]
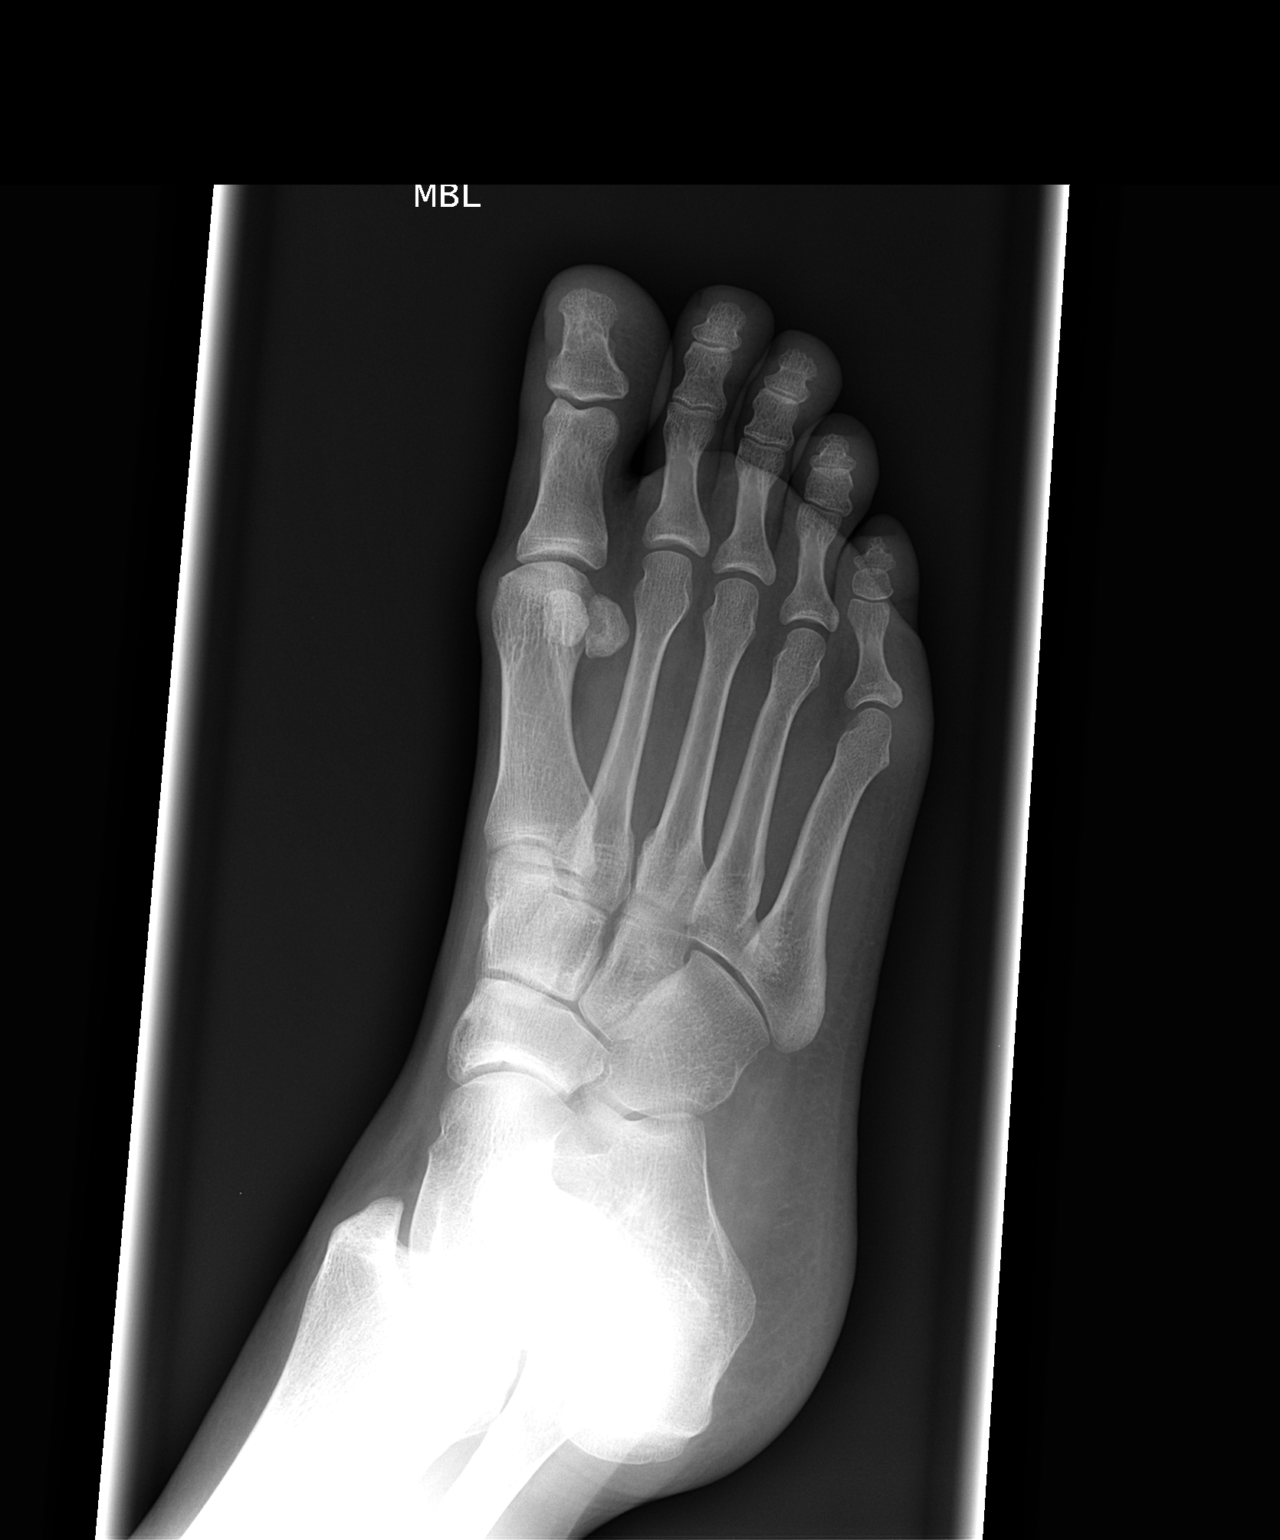

[view not recorded (3 of 3)]
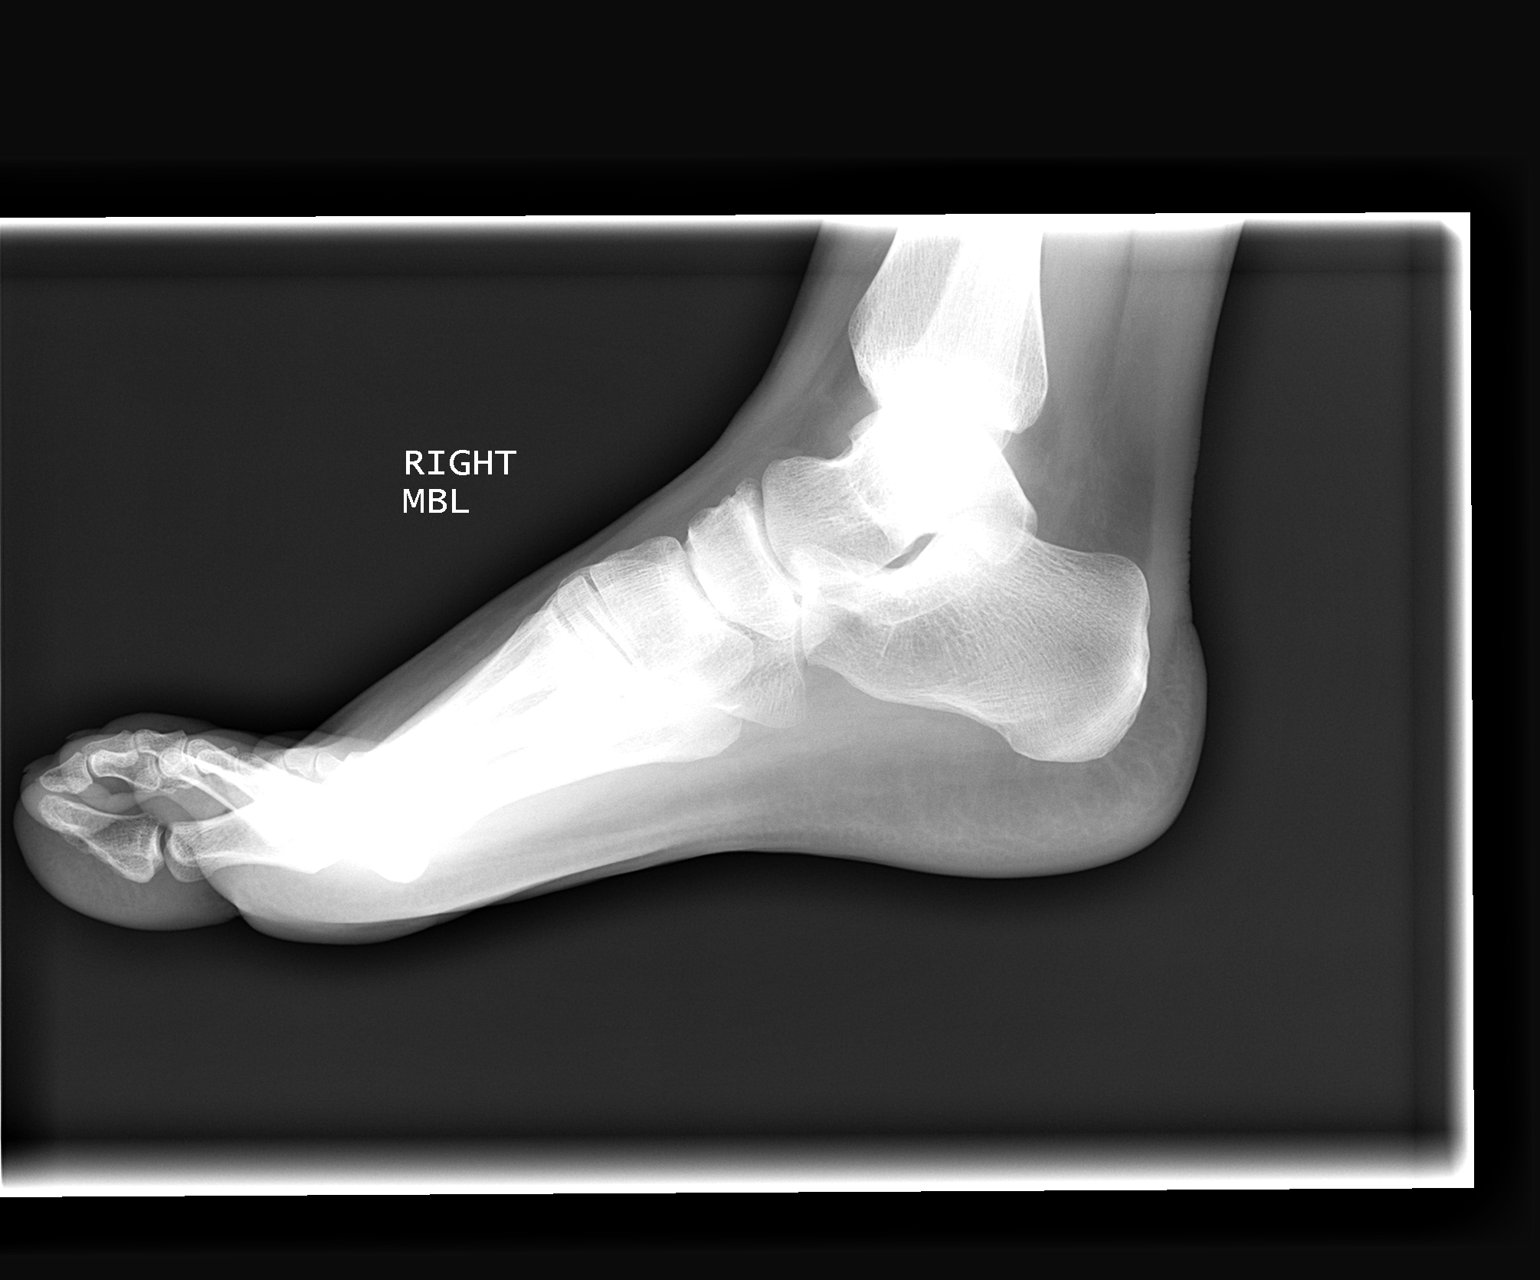

[3 of 3 positions shown; findings below may reference images not displayed]

FINDINGS: Three views of the right foot submitted.  No acute
fracture or subluxation.  No radiopaque foreign body.
IMPRESSION: No acute fracture or subluxation.

## 2014-11-21 ENCOUNTER — Encounter (HOSPITAL_COMMUNITY): Payer: Self-pay | Admitting: Vascular Surgery

## 2014-11-21 ENCOUNTER — Emergency Department (HOSPITAL_COMMUNITY)
Admission: EM | Admit: 2014-11-21 | Discharge: 2014-11-21 | Disposition: A | Discharge status: Home / Self Care | Payer: BLUE CROSS/BLUE SHIELD | Attending: Emergency Medicine | Admitting: Emergency Medicine

## 2014-11-21 DIAGNOSIS — R569 Unspecified convulsions: Secondary | ICD-10-CM | POA: Insufficient documentation

## 2014-11-21 DIAGNOSIS — G8929 Other chronic pain: Secondary | ICD-10-CM | POA: Insufficient documentation

## 2014-11-21 DIAGNOSIS — Y9389 Activity, other specified: Secondary | ICD-10-CM | POA: Diagnosis not present

## 2014-11-21 DIAGNOSIS — L089 Local infection of the skin and subcutaneous tissue, unspecified: Secondary | ICD-10-CM

## 2014-11-21 DIAGNOSIS — S0081XA Abrasion of other part of head, initial encounter: Secondary | ICD-10-CM | POA: Insufficient documentation

## 2014-11-21 DIAGNOSIS — I10 Essential (primary) hypertension: Secondary | ICD-10-CM | POA: Diagnosis not present

## 2014-11-21 DIAGNOSIS — Y998 Other external cause status: Secondary | ICD-10-CM | POA: Insufficient documentation

## 2014-11-21 DIAGNOSIS — Z79899 Other long term (current) drug therapy: Secondary | ICD-10-CM | POA: Insufficient documentation

## 2014-11-21 DIAGNOSIS — S0083XA Contusion of other part of head, initial encounter: Secondary | ICD-10-CM

## 2014-11-21 DIAGNOSIS — W1839XA Other fall on same level, initial encounter: Secondary | ICD-10-CM | POA: Diagnosis not present

## 2014-11-21 DIAGNOSIS — Z91040 Latex allergy status: Secondary | ICD-10-CM | POA: Insufficient documentation

## 2014-11-21 DIAGNOSIS — Y9289 Other specified places as the place of occurrence of the external cause: Secondary | ICD-10-CM | POA: Diagnosis not present

## 2014-11-21 DIAGNOSIS — Z720 Tobacco use: Secondary | ICD-10-CM | POA: Insufficient documentation

## 2014-11-21 DIAGNOSIS — Z87448 Personal history of other diseases of urinary system: Secondary | ICD-10-CM | POA: Diagnosis not present

## 2014-11-21 DIAGNOSIS — F445 Conversion disorder with seizures or convulsions: Secondary | ICD-10-CM

## 2014-11-21 DIAGNOSIS — S1081XA Abrasion of other specified part of neck, initial encounter: Secondary | ICD-10-CM

## 2014-11-21 MED ORDER — HYDROCODONE-ACETAMINOPHEN 5-325 MG PO TABS
2.0000 | ORAL_TABLET | Freq: Once | ORAL | Status: AC
  Administered 2014-11-21: 2 via ORAL
  Filled 2014-11-21: qty 2

## 2014-11-21 MED ORDER — HYDROCODONE-ACETAMINOPHEN 5-325 MG PO TABS: 1.0000 | ORAL_TABLET | ORAL | Status: DC | PRN

## 2014-11-21 NOTE — ED Provider Notes (Signed)
CSN: [id number]     Arrival date & time 11/21/14  1600 History   First MD Initiated Contact with Patient 11/21/14 1615     Chief Complaint  Patient presents with  . Seizures     (Consider location/radiation/quality/duration/timing/severity/associated sxs/prior Treatment) HPI   Stacy Mejia is a 34 y.o. female who states that she was standing on her porch when she suddenly had a seizure causing her to fall off the porch and striking her face on the ground. Some people were at her home and called an ambulance to bring her here. She apparently woke up on the ground, was able to ambulate on her own. She presents here, without immobilization. She complains of pain only in her face. She complains of abrasion on her feet. She denies recent illnesses, including fever, chills, nausea, vomiting, cough, shortness of breath or chest pain. She is not currently taking any pain medications. She has a history of nonepileptic seizures, and recently had a EEG, and evaluation by neurology. There are no other known modifying factors.   Past Medical History  Diagnosis Date  . Interstitial cystitis   . Chronic pain   . Hypertension    Past Surgical History  Procedure Laterality Date  . Cholecystectomy    . Bladder stimulater    . Bladder surgery     Family History  Problem Relation Age of Onset  . Hyperlipidemia Mother   . Hypertension Mother   . Hyperlipidemia Father   . Hypertension Father   . Diabetes Father   . Heart disease Father   . Depression Father   . Seizures Brother 34    drug and alocohol use  . Alcohol abuse Brother   . Cancer Maternal Grandmother     colon  . Cancer Paternal Grandmother     colon   History  Substance Use Topics  . Smoking status: Current Every Day Smoker -- 1.00 packs/day    Types: Cigarettes  . Smokeless tobacco: Never Used  . Alcohol Use: No   OB History    No data available     Review of Systems  All other systems reviewed and are  negative.     Allergies  Tequin; Adhesive; and Latex  Home Medications   Prior to Admission medications   Medication Sig Start Date End Date Taking? Authorizing Provider  aspirin-acetaminophen-caffeine (EXCEDRIN MIGRAINE) [phone] MG per tablet Take 1 tablet by mouth every 6 (six) hours as needed. For pain    Historical Provider, MD  diazepam (VALIUM) 10 MG tablet Take 1 tablet (10 mg total) by mouth at bedtime. for anxiety 06/15/14   Myrlene Brokereborah R Ross, MD  gabapentin (NEURONTIN) 800 MG tablet 1 tablet 3 (three) times daily. 03/25/14   Historical Provider, MD  HYDROcodone-acetaminophen (NORCO) 10-325 MG per tablet Take 1 tablet by mouth every 6 (six) hours as needed (1-2 tabs).    Historical Provider, MD  medroxyPROGESTERone (DEPO-PROVERA) 150 MG/ML injection Inject into the muscle. Every 3 months    Historical Provider, MD  Multiple Vitamins-Minerals (ADULT ONE DAILY GUMMIES) CHEW Chew 2-3 each by mouth daily.    Historical Provider, MD  nitrofurantoin (MACRODANTIN) 100 MG capsule Take 100 mg by mouth at bedtime.    Historical Provider, MD  omeprazole (PRILOSEC) 40 MG capsule Take 40 mg by mouth every morning.    Historical Provider, MD  phenazopyridine (PYRIDIUM) 200 MG tablet Take 400 mg by mouth at bedtime.     Historical Provider, MD  sertraline (ZOLOFT) 50 MG tablet  Take 1 tablet (50 mg total) by mouth daily. 06/15/14 06/15/15  Myrlene Brokereborah R Ross, MD   BP 120/77 mmHg  Pulse 98  Temp(Src) 97.7 F (36.5 C) (Oral)  Resp 15  Ht 5\' 2"  (1.575 m)  Wt 135 lb (61.236 kg)  BMI 24.69 kg/m2  SpO2 98% Physical Exam  Constitutional: She is oriented to person, place, and time. She appears well-developed and well-nourished. No distress.  HENT:  Head: Normocephalic.  Right Ear: External ear normal.  Left Ear: External ear normal.  Moderate swelling with ecchymosis right upper lateral cheek without associated abrasion or laceration. No trismus. No mandibular tenderness. No facial crepitation or  deformity. Small midforehead contusion, with small abrasion, nonbleeding.  Eyes: Conjunctivae and EOM are normal. Pupils are equal, round, and reactive to light.  Neck: Normal range of motion and phonation normal. Neck supple.  Cardiovascular: Normal rate, regular rhythm and normal heart sounds.   Pulmonary/Chest: Effort normal and breath sounds normal. She exhibits no bony tenderness.  Abdominal: Soft. There is no tenderness.  Musculoskeletal: Normal range of motion. She exhibits no tenderness.  Neurological: She is alert and oriented to person, place, and time. No cranial nerve deficit or sensory deficit. She exhibits normal muscle tone. Coordination normal.  Skin: Skin is warm, dry and intact.  Psychiatric: She has a normal mood and affect. Her behavior is normal. Judgment and thought content normal.  Nursing note and vitals reviewed.   ED Course  Procedures (including critical care time)  Findings discussed with patient, all questions answered.   Labs Review Labs Reviewed - No data to display  Imaging Review No results found.   EKG Interpretation None      MDM   Final diagnoses:  Pseudoseizure  Contusion of face, initial encounter  Abrasion, face with infection, initial encounter    Fall associated nonepileptic seizure. Patient is nontoxic. Doubt serous bacterial infection. Metabolic instability or impending vascular collapse. No clinical evidence for fracture or serious head injury.  Nursing Notes Reviewed/ Care Coordinated Applicable Imaging Reviewed Interpretation of Laboratory Data incorporated into ED treatment  The patient appears reasonably screened and/or stabilized for discharge and I doubt any other medical condition or other [facility] requiring further screening, evaluation, or treatment in the ED at this time prior to discharge.  Plan: Home Medications- IBU; Home Treatments- cryotherapy,rest; return here if the recommended treatment, does not improve the  symptoms; Recommended follow up- PCP prn     Mancel BaleElliott Perry Molla, MD 11/21/14 [id number]

## 2014-11-21 NOTE — Discharge Instructions (Signed)
Use ice on the sore areas 3 times a day for 3 days. Take ibuprofen for pain. Clean the abrasions with soap and water twice a day.    Abrasion An abrasion is a cut or scrape of the skin. Abrasions do not extend through all layers of the skin and most heal within 10 days. It is important to care for your abrasion properly to prevent infection. CAUSES  Most abrasions are caused by falling on, or gliding across, the ground or other surface. When your skin rubs on something, the outer and inner layer of skin rubs off, causing an abrasion. DIAGNOSIS  Your caregiver will be able to diagnose an abrasion during a physical exam.  TREATMENT  Your treatment depends on how large and deep the abrasion is. Generally, your abrasion will be cleaned with water and a mild soap to remove any dirt or debris. An antibiotic ointment may be put over the abrasion to prevent an infection. A bandage (dressing) may be wrapped around the abrasion to keep it from getting dirty.  You may need a tetanus shot if:  You cannot remember when you had your last tetanus shot.  You have never had a tetanus shot.  The injury broke your skin. If you get a tetanus shot, your arm may swell, get red, and feel warm to the touch. This is common and not a problem. If you need a tetanus shot and you choose not to have one, there is a rare chance of getting tetanus. Sickness from tetanus can be serious.  HOME CARE INSTRUCTIONS   If a dressing was applied, change it at least once a day or as directed by your caregiver. If the bandage sticks, soak it off with warm water.   Wash the area with water and a mild soap to remove all the ointment 2 times a day. Rinse off the soap and pat the area dry with a clean towel.   Reapply any ointment as directed by your caregiver. This will help prevent infection and keep the bandage from sticking. Use gauze over the wound and under the dressing to help keep the bandage from sticking.   Change your  dressing right away if it becomes wet or dirty.   Only take over-the-counter or prescription medicines for pain, discomfort, or fever as directed by your caregiver.   Follow up with your caregiver within 24-48 hours for a wound check, or as directed. If you were not given a wound-check appointment, look closely at your abrasion for redness, swelling, or pus. These are signs of infection. SEEK IMMEDIATE MEDICAL CARE IF:   You have increasing pain in the wound.   You have redness, swelling, or tenderness around the wound.   You have pus coming from the wound.   You have a fever or persistent symptoms for more than 2-3 days.  You have a fever and your symptoms suddenly get worse.  You have a bad smell coming from the wound or dressing.  MAKE SURE YOU:   Understand these instructions.  Will watch your condition.  Will get help right away if you are not doing well or get worse. Document Released: 04/01/2005 Document Revised: 06/08/2012 Document Reviewed: 05/26/2011 [facility] Patient Information 2015 [place], [place]. This information is not intended to replace advice given to you by your health care provider. Make sure you discuss any questions you have with your health care provider.  Contusion A contusion is a deep bruise. Contusions are the result of an injury that  caused bleeding under the skin. The contusion may turn blue, purple, or yellow. Minor injuries will give you a painless contusion, but more severe contusions may stay painful and swollen for a few weeks.  CAUSES  A contusion is usually caused by a blow, trauma, or direct force to an area of the body. SYMPTOMS   Swelling and redness of the injured area.  Bruising of the injured area.  Tenderness and soreness of the injured area.  Pain. DIAGNOSIS  The diagnosis can be made by taking a history and physical exam. An X-ray, CT scan, or MRI may be needed to determine if there were any associated injuries, such as  fractures. TREATMENT  Specific treatment will depend on what area of the body was injured. In general, the best treatment for a contusion is resting, icing, elevating, and applying cold compresses to the injured area. Over-the-counter medicines may also be recommended for pain control. Ask your caregiver what the best treatment is for your contusion. HOME CARE INSTRUCTIONS   Put ice on the injured area.  Put ice in a plastic bag.  Place a towel between your skin and the bag.  Leave the ice on for 15-20 minutes, 3-4 times a day, or as directed by your health care provider.  Only take over-the-counter or prescription medicines for pain, discomfort, or fever as directed by your caregiver. Your caregiver may recommend avoiding anti-inflammatory medicines (aspirin, ibuprofen, and naproxen) for 48 hours because these medicines may increase bruising.  Rest the injured area.  If possible, elevate the injured area to reduce swelling. SEEK IMMEDIATE MEDICAL CARE IF:   You have increased bruising or swelling.  You have pain that is getting worse.  Your swelling or pain is not relieved with medicines. MAKE SURE YOU:   Understand these instructions.  Will watch your condition.  Will get help right away if you are not doing well or get worse. Document Released: 04/01/2005 Document Revised: 06/27/2013 Document Reviewed: 04/27/2011 [facility] Patient Information 2015 [place], [place]. This information is not intended to replace advice given to you by your health care provider. Make sure you discuss any questions you have with your health care provider.

## 2014-11-21 NOTE — ED Notes (Signed)
Pt placed into gown and on monitor upon arrival to room. Pt monitored by blood pressure, pulse ox, and 5 lead.  
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
## 2014-12-11 ENCOUNTER — Ambulatory Visit: Payer: Self-pay | Admitting: Neurology

## 2015-01-17 ENCOUNTER — Ambulatory Visit: Payer: Self-pay | Admitting: Neurology

## 2015-02-09 ENCOUNTER — Encounter (HOSPITAL_COMMUNITY): Payer: Self-pay | Admitting: Family Medicine

## 2015-02-09 ENCOUNTER — Emergency Department (HOSPITAL_COMMUNITY)
Admission: EM | Admit: 2015-02-09 | Discharge: 2015-02-09 | Disposition: A | Discharge status: Home / Self Care | Payer: BLUE CROSS/BLUE SHIELD | Attending: Emergency Medicine | Admitting: Emergency Medicine

## 2015-02-09 ENCOUNTER — Emergency Department (HOSPITAL_COMMUNITY): Payer: BLUE CROSS/BLUE SHIELD

## 2015-02-09 DIAGNOSIS — Y9389 Activity, other specified: Secondary | ICD-10-CM | POA: Diagnosis not present

## 2015-02-09 DIAGNOSIS — Y92410 Unspecified street and highway as the place of occurrence of the external cause: Secondary | ICD-10-CM | POA: Diagnosis not present

## 2015-02-09 DIAGNOSIS — Z720 Tobacco use: Secondary | ICD-10-CM | POA: Diagnosis not present

## 2015-02-09 DIAGNOSIS — Z87448 Personal history of other diseases of urinary system: Secondary | ICD-10-CM | POA: Insufficient documentation

## 2015-02-09 DIAGNOSIS — Y998 Other external cause status: Secondary | ICD-10-CM | POA: Insufficient documentation

## 2015-02-09 DIAGNOSIS — S4992XA Unspecified injury of left shoulder and upper arm, initial encounter: Secondary | ICD-10-CM | POA: Diagnosis present

## 2015-02-09 DIAGNOSIS — Z91040 Latex allergy status: Secondary | ICD-10-CM | POA: Diagnosis not present

## 2015-02-09 DIAGNOSIS — S20219A Contusion of unspecified front wall of thorax, initial encounter: Secondary | ICD-10-CM | POA: Insufficient documentation

## 2015-02-09 DIAGNOSIS — Z79899 Other long term (current) drug therapy: Secondary | ICD-10-CM | POA: Insufficient documentation

## 2015-02-09 DIAGNOSIS — S1093XA Contusion of unspecified part of neck, initial encounter: Secondary | ICD-10-CM | POA: Diagnosis not present

## 2015-02-09 DIAGNOSIS — I10 Essential (primary) hypertension: Secondary | ICD-10-CM | POA: Insufficient documentation

## 2015-02-09 DIAGNOSIS — G8929 Other chronic pain: Secondary | ICD-10-CM | POA: Insufficient documentation

## 2015-02-09 DIAGNOSIS — S46912A Strain of unspecified muscle, fascia and tendon at shoulder and upper arm level, left arm, initial encounter: Secondary | ICD-10-CM | POA: Diagnosis not present

## 2015-02-09 DIAGNOSIS — S27892A Contusion of other specified intrathoracic organs, initial encounter: Secondary | ICD-10-CM

## 2015-02-09 LAB — BASIC METABOLIC PANEL
ANION GAP: 9 (ref 5–15)
BUN: 5 mg/dL — ABNORMAL LOW (ref 6–20)
CALCIUM: 8.7 mg/dL — AB (ref 8.9–10.3)
CO2: 22 mmol/L (ref 22–32)
Chloride: 106 mmol/L (ref 101–111)
Creatinine, Ser: 0.6 mg/dL (ref 0.44–1.00)
GFR calc Af Amer: >60 mL/min (ref >60)
GLUCOSE: 111 mg/dL — AB (ref 65–99)
Potassium: 3.3 mmol/L — ABNORMAL LOW (ref 3.5–5.1)
Sodium: 137 mmol/L (ref 135–145)

## 2015-02-09 LAB — CBC WITH DIFFERENTIAL/PLATELET
Basophils Absolute: 0 10*3/uL (ref 0.0–0.1)
Basophils Relative: 0 % (ref 0–1)
EOS ABS: 0.2 10*3/uL (ref 0.0–0.7)
Eosinophils Relative: 1 % (ref 0–5)
HEMATOCRIT: 34.6 % — AB (ref 36.0–46.0)
HEMOGLOBIN: 11.6 g/dL — AB (ref 12.0–15.0)
LYMPHS ABS: 1.9 10*3/uL (ref 0.7–4.0)
LYMPHS PCT: 16 % (ref 12–46)
MCH: 30.8 pg (ref 26.0–34.0)
MCHC: 33.5 g/dL (ref 30.0–36.0)
MCV: 91.8 fL (ref 78.0–100.0)
MONO ABS: 0.7 10*3/uL (ref 0.1–1.0)
MONOS PCT: 6 % (ref 3–12)
NEUTROS ABS: 9.4 10*3/uL — AB (ref 1.7–7.7)
NEUTROS PCT: 77 % (ref 43–77)
Platelets: 366 10*3/uL (ref 150–400)
RBC: 3.77 MIL/uL — ABNORMAL LOW (ref 3.87–5.11)
RDW: 15.1 % (ref 11.5–15.5)
WBC: 12.2 10*3/uL — ABNORMAL HIGH (ref 4.0–10.5)

## 2015-02-09 LAB — ETHANOL

## 2015-02-09 LAB — I-STAT BETA HCG BLOOD, ED (MC, WL, AP ONLY)

## 2015-02-09 MED ORDER — IOHEXOL 350 MG/ML SOLN
75.0000 mL | Freq: Once | INTRAVENOUS | Status: AC | PRN
  Administered 2015-02-09: 75 mL via INTRAVENOUS

## 2015-02-09 MED ORDER — HYDROCODONE-ACETAMINOPHEN 5-325 MG PO TABS: 2.0000 | ORAL_TABLET | ORAL | Status: DC | PRN

## 2015-02-09 NOTE — ED Notes (Signed)
Patient transported to CT 

## 2015-02-09 NOTE — ED Notes (Signed)
Patient transported to X-ray 
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
## 2015-02-09 NOTE — ED Notes (Signed)
Pt is in stable condition upon d/c and is escorted from ED via wheelchair. 
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
## 2015-02-14 ENCOUNTER — Encounter (HOSPITAL_COMMUNITY): Payer: Self-pay

## 2015-02-14 ENCOUNTER — Emergency Department (HOSPITAL_COMMUNITY)
Admission: EM | Admit: 2015-02-14 | Discharge: 2015-02-14 | Disposition: A | Discharge status: Home / Self Care | Payer: BLUE CROSS/BLUE SHIELD | Attending: Emergency Medicine | Admitting: Emergency Medicine

## 2015-02-14 DIAGNOSIS — G8929 Other chronic pain: Secondary | ICD-10-CM | POA: Insufficient documentation

## 2015-02-14 DIAGNOSIS — R569 Unspecified convulsions: Secondary | ICD-10-CM | POA: Diagnosis not present

## 2015-02-14 DIAGNOSIS — Z87448 Personal history of other diseases of urinary system: Secondary | ICD-10-CM | POA: Diagnosis not present

## 2015-02-14 DIAGNOSIS — Z720 Tobacco use: Secondary | ICD-10-CM | POA: Insufficient documentation

## 2015-02-14 DIAGNOSIS — Z79899 Other long term (current) drug therapy: Secondary | ICD-10-CM | POA: Insufficient documentation

## 2015-02-14 DIAGNOSIS — F1110 Opioid abuse, uncomplicated: Secondary | ICD-10-CM

## 2015-02-14 DIAGNOSIS — Z91040 Latex allergy status: Secondary | ICD-10-CM | POA: Insufficient documentation

## 2015-02-14 DIAGNOSIS — I10 Essential (primary) hypertension: Secondary | ICD-10-CM | POA: Insufficient documentation

## 2015-02-14 NOTE — ED Notes (Signed)
Pt left before this RN could provide d/c paperwork.  Pt and visitor reported that they were going to another facility.

## 2015-02-14 NOTE — ED Provider Notes (Signed)
CSN: [id number]     Arrival date & time 02/14/15  1209 History   First MD Initiated Contact with Patient 02/14/15 1240     Chief Complaint  Patient presents with  . Opiate Detox      (Consider location/radiation/quality/duration/timing/severity/associated sxs/prior Treatment) The history is provided by the patient and a relative. No language interpreter was used.   Stacy Mejia is a 34 year old femalewith a history of hypertension and drug abuse who presents for opiate and heroin detox. Her last use was yesterday morning. She states she had a seizure yesterday afternoon due to withdrawal. She says she was standing in the kitchen and fell, hitting her head on the ground.she states she has seizures multiple times a day and has been evaluated for this by neurology. She states that she does not have a seizure disorder. She is angry that we do not have a detox program, when she was referred here by behavioral health. She denies any complaints at this time. She denies any chest pain, shortness of breath, dizziness, nausea, vomiting. Past Medical History  Diagnosis Date  . Interstitial cystitis   . Chronic pain   . Hypertension    Past Surgical History  Procedure Laterality Date  . Cholecystectomy    . Bladder stimulater    . Bladder surgery     Family History  Problem Relation Age of Onset  . Hyperlipidemia Mother   . Hypertension Mother   . Hyperlipidemia Father   . Hypertension Father   . Diabetes Father   . Heart disease Father   . Depression Father   . Seizures Brother 34    drug and alocohol use  . Alcohol abuse Brother   . Cancer Maternal Grandmother     colon  . Cancer Paternal Grandmother     colon   Social History  Substance Use Topics  . Smoking status: Current Every Day Smoker -- 1.00 packs/day    Types: Cigarettes  . Smokeless tobacco: Never Used  . Alcohol Use: No   OB History    No data available     Review of Systems  Constitutional: Negative for fever.   Eyes: Negative for visual disturbance.  Respiratory: Negative for shortness of breath.   Cardiovascular: Negative for chest pain.  Gastrointestinal: Negative for nausea, vomiting and abdominal pain.  Neurological: Positive for seizures. Negative for weakness.      Allergies  Tequin; Adhesive; and Latex  Home Medications   Prior to Admission medications   Medication Sig Start Date End Date Taking? Authorizing Provider  ALPRAZolam Prudy Feeler) 1 MG tablet Take 1 mg by mouth 3 (three) times daily as needed for anxiety.  02/06/15  Yes Historical Provider, MD  aspirin-acetaminophen-caffeine (EXCEDRIN MIGRAINE) [phone] MG per tablet Take 1 tablet by mouth every 6 (six) hours as needed for headache or migraine.    Yes Historical Provider, MD  gabapentin (NEURONTIN) 800 MG tablet Take 800 mg by mouth 3 (three) times daily.  03/25/14  Yes Historical Provider, MD  HYDROcodone-acetaminophen (NORCO/VICODIN) 5-325 MG per tablet Take 2 tablets by mouth every 4 (four) hours as needed for moderate pain. 02/09/15  Yes Gilda Crease, MD  medroxyPROGESTERone (DEPO-PROVERA) 150 MG/ML injection Inject 150 mg into the muscle every 3 (three) months.    Yes Historical Provider, MD  omeprazole (PRILOSEC) 40 MG capsule Take 40 mg by mouth every morning.   Yes Historical Provider, MD  phenazopyridine (PYRIDIUM) 200 MG tablet Take 400 mg by mouth as needed (bladder flare up).  Yes Historical Provider, MD  sertraline (ZOLOFT) 50 MG tablet Take 1 tablet (50 mg total) by mouth daily. 06/15/14 06/15/15 Yes Myrlene Broker, MD  zolpidem (AMBIEN) 10 MG tablet Take 10 mg by mouth at bedtime as needed for sleep.  02/11/15  Yes Historical Provider, MD   BP 135/87 mmHg  Pulse 100  Temp(Src) 98.4 F (36.9 C) (Oral)  Resp 16  SpO2 99% Physical Exam  Constitutional: She is oriented to person, place, and time. She appears well-developed and well-nourished.  She is not tremulous or diaphoretic.  HENT:  Head:  Normocephalic and atraumatic.  Eyes: Conjunctivae are normal.  Neck: Normal range of motion.  Cardiovascular: Normal rate.   Pulmonary/Chest: Effort normal. No respiratory distress.  Abdominal: Soft. She exhibits no distension.  Musculoskeletal: Normal range of motion.  Neurological: She is alert and oriented to person, place, and time. She has normal strength. No sensory deficit. Gait normal.  Ambulatory with steady gait.  Skin: Skin is warm and dry.  Psychiatric: Her behavior is normal. Her affect is angry.  Nursing note and vitals reviewed.   ED Course  Procedures (including critical care time) Labs Review Labs Reviewed - No data to display  Imaging Review No results found.   EKG Interpretation None      MDM   Final diagnoses:  Heroin abuse  Opiate abuse, episodic   Patient presents for heroin and opiate detox. Her vitals are stable. Her exam is normal. She is not in DTs. While discussing medication and outpatient detox options, the patient left without discharge paperwork. She stated she was going to [facility] where she would get inpatient care.    Catha Gosselin, PA-C 02/14/15 1330  Lavera Guise, MD 02/14/15 [phone]

## 2015-02-14 NOTE — ED Notes (Signed)
Pt presents wanting opiate detox.  Pt reports have issues w/ "pills and heroin."  Last use was "hydorcodone" x 2 pills and last heroin use x 6 days.

## 2015-04-18 ENCOUNTER — Emergency Department (HOSPITAL_COMMUNITY)
Admission: EM | Admit: 2015-04-18 | Discharge: 2015-04-19 | Disposition: A | Discharge status: Home / Self Care | Payer: BLUE CROSS/BLUE SHIELD | Attending: Emergency Medicine | Admitting: Emergency Medicine

## 2015-04-18 DIAGNOSIS — R51 Headache: Secondary | ICD-10-CM | POA: Insufficient documentation

## 2015-04-18 DIAGNOSIS — G8929 Other chronic pain: Secondary | ICD-10-CM | POA: Insufficient documentation

## 2015-04-18 DIAGNOSIS — R5383 Other fatigue: Secondary | ICD-10-CM | POA: Diagnosis not present

## 2015-04-18 DIAGNOSIS — Z8742 Personal history of other diseases of the female genital tract: Secondary | ICD-10-CM | POA: Insufficient documentation

## 2015-04-18 DIAGNOSIS — Z3202 Encounter for pregnancy test, result negative: Secondary | ICD-10-CM | POA: Diagnosis not present

## 2015-04-18 DIAGNOSIS — I10 Essential (primary) hypertension: Secondary | ICD-10-CM | POA: Insufficient documentation

## 2015-04-18 DIAGNOSIS — R110 Nausea: Secondary | ICD-10-CM | POA: Diagnosis not present

## 2015-04-18 DIAGNOSIS — R569 Unspecified convulsions: Secondary | ICD-10-CM | POA: Diagnosis not present

## 2015-04-18 DIAGNOSIS — Z720 Tobacco use: Secondary | ICD-10-CM | POA: Diagnosis not present

## 2015-04-18 DIAGNOSIS — Z79899 Other long term (current) drug therapy: Secondary | ICD-10-CM | POA: Insufficient documentation

## 2015-04-18 DIAGNOSIS — Z793 Long term (current) use of hormonal contraceptives: Secondary | ICD-10-CM | POA: Insufficient documentation

## 2015-04-18 DIAGNOSIS — R000 Tachycardia, unspecified: Secondary | ICD-10-CM | POA: Insufficient documentation

## 2015-04-18 LAB — CBG MONITORING, ED: GLUCOSE-CAPILLARY: 83 mg/dL (ref 65–99)

## 2015-04-18 MED ORDER — SODIUM CHLORIDE 0.9 % IV BOLUS (SEPSIS)
1000.0000 mL | Freq: Once | INTRAVENOUS | Status: AC
  Administered 2015-04-19: 1000 mL via INTRAVENOUS

## 2015-04-18 MED ORDER — ONDANSETRON HCL 4 MG/2ML IJ SOLN
4.0000 mg | Freq: Once | INTRAMUSCULAR | Status: AC
  Administered 2015-04-19: 4 mg via INTRAVENOUS
  Filled 2015-04-18: qty 2
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
## 2015-04-18 NOTE — ED Notes (Signed)
Bed: [id number] Expected date:  Expected time:  Means of arrival:  Comments: [facility] Seizure

## 2015-04-18 NOTE — ED Notes (Signed)
Per [facility], seizure activity was witnessed at 2100, 2145, and 2235. 2.5mg  versed given at 2237 after third seizure. zofran 4mg  given at 2208. Hx of seizures. VSS- cbg of 110.

## 2015-04-19 LAB — CBC WITH DIFFERENTIAL/PLATELET
BASOS ABS: 0 10*3/uL (ref 0.0–0.1)
Basophils Relative: 0 %
EOS PCT: 1 %
Eosinophils Absolute: 0.2 10*3/uL (ref 0.0–0.7)
HCT: 33 % — ABNORMAL LOW (ref 36.0–46.0)
HEMOGLOBIN: 11.2 g/dL — AB (ref 12.0–15.0)
LYMPHS ABS: 1.8 10*3/uL (ref 0.7–4.0)
LYMPHS PCT: 10 %
MCH: 30.7 pg (ref 26.0–34.0)
MCHC: 33.9 g/dL (ref 30.0–36.0)
MCV: 90.4 fL (ref 78.0–100.0)
Monocytes Absolute: 0.8 10*3/uL (ref 0.1–1.0)
Monocytes Relative: 5 %
NEUTROS ABS: 14.5 10*3/uL — AB (ref 1.7–7.7)
Neutrophils Relative %: 84 %
PLATELETS: 388 10*3/uL (ref 150–400)
RBC: 3.65 MIL/uL — AB (ref 3.87–5.11)
RDW: 15.2 % (ref 11.5–15.5)
WBC: 17.3 10*3/uL — AB (ref 4.0–10.5)

## 2015-04-19 LAB — URINALYSIS, ROUTINE W REFLEX MICROSCOPIC
Bilirubin Urine: NEGATIVE
Glucose, UA: NEGATIVE mg/dL
HGB URINE DIPSTICK: NEGATIVE
Ketones, ur: NEGATIVE mg/dL
Leukocytes, UA: NEGATIVE
Nitrite: NEGATIVE
PH: 5.5 (ref 5.0–8.0)
Protein, ur: NEGATIVE mg/dL
SPECIFIC GRAVITY, URINE: 1.012 (ref 1.005–1.030)
Urobilinogen, UA: 0.2 mg/dL (ref 0.0–1.0)

## 2015-04-19 LAB — RAPID URINE DRUG SCREEN, HOSP PERFORMED
AMPHETAMINES: NOT DETECTED
Barbiturates: NOT DETECTED
Benzodiazepines: POSITIVE — AB
Cocaine: NOT DETECTED
Opiates: NOT DETECTED
TETRAHYDROCANNABINOL: NOT DETECTED

## 2015-04-19 LAB — COMPREHENSIVE METABOLIC PANEL
ALBUMIN: 4.2 g/dL (ref 3.5–5.0)
ALK PHOS: 97 U/L (ref 38–126)
ALT: 13 U/L — AB (ref 14–54)
AST: 20 U/L (ref 15–41)
Anion gap: 8 (ref 5–15)
BUN: 19 mg/dL (ref 6–20)
CALCIUM: 9.4 mg/dL (ref 8.9–10.3)
CHLORIDE: 108 mmol/L (ref 101–111)
CO2: 21 mmol/L — AB (ref 22–32)
CREATININE: 0.74 mg/dL (ref 0.44–1.00)
GFR calc Af Amer: >60 mL/min (ref >60)
GFR calc non Af Amer: >60 mL/min (ref >60)
GLUCOSE: 96 mg/dL (ref 65–99)
Potassium: 4.4 mmol/L (ref 3.5–5.1)
SODIUM: 137 mmol/L (ref 135–145)
Total Bilirubin: 0.3 mg/dL (ref 0.3–1.2)
Total Protein: 7.9 g/dL (ref 6.5–8.1)

## 2015-04-19 LAB — ETHANOL

## 2015-04-19 LAB — PREGNANCY, URINE: Preg Test, Ur: NEGATIVE

## 2015-04-19 NOTE — ED Notes (Signed)
Went to talk to pt regarding urine sample.  Pt sleeping, hard to arouse/awake. RN notified.

## 2015-04-19 NOTE — Discharge Instructions (Signed)
Nonepileptic Seizures °Nonepileptic seizures are seizures that are not caused by abnormal electrical signals in your brain. These seizures often seem like epileptic seizures, but they are not caused by epilepsy.  °There are two types of nonepileptic seizures: °· A physiologic nonepileptic seizure results from a disruption in your brain. °· A psychogenic seizure results from emotional stress. These seizures are sometimes called pseudoseizures. °CAUSES  °Causes of physiologic nonepileptic seizures include:  °· Sudden drop in blood pressure. °· Low blood sugar. °· Low levels of salt (sodium) in your blood. °· Low levels of calcium in your blood. °· Migraine. °· Heart rhythm problems. °· Sleep disorders. °· Drug and alcohol abuse. °Common causes of psychogenic nonepileptic seizures include: °· Stress. °· Emotional trauma. °· Sexual or physical abuse. °· Major life events, such as divorce or the death of a loved one. °· Mental health disorders, including panic attack and hyperactivity disorder. °SIGNS AND SYMPTOMS °A nonepileptic seizure can look like an epileptic seizure, including uncontrollable shaking (convulsions), or changes in attention, behavior, or the ability to remain awake and alert. However, there are some differences. Nonepileptic seizures usually: °· Do not cause physical injuries. °· Start slowly. °· Include crying or shrieking. °· Last longer than 2 minutes. °· Have a short recovery time without headache or exhaustion. °DIAGNOSIS  °Your health care provider can usually diagnose nonepileptic seizures after taking your medical history and giving you a physical exam. Your health care provider may want to talk to your friends or relatives who have seen you have a seizure.  °You may also need to have tests to look for causes of physiologic nonepileptic seizures. This may include an electroencephalogram (EEG), which is a test that measures electrical activity in your brain. If you have had an epileptic  seizure, the results of your EEG will be abnormal. If your health care provider thinks you have had a psychogenic nonepileptic seizure, you may need to see a mental health specialist for an evaluation. °TREATMENT  °Treatment depends on the type and cause of your seizures. °· For physiologic nonepileptic seizures, treatment is aimed at addressing the underlying condition that caused the seizures. These seizures usually stop when the underlying condition is properly treated. °· Nonepileptic seizures do not respond to the seizure medicines used to treat epilepsy. °· For psychogenic seizures, you may need to work with a mental health specialist. °HOME CARE INSTRUCTIONS °Home care will depend on the type of nonepileptic seizures you have.  °· Follow all your health care provider's instructions. °· Keep all your follow-up appointments. °SEEK MEDICAL CARE IF: °You continue to have seizures after treatment. °SEEK IMMEDIATE MEDICAL CARE IF: °· Your seizures change or become more frequent. °· You injure yourself during a seizure. °· You have one seizure after another. °· You have trouble recovering from a seizure. °· You have chest pain or trouble breathing. °MAKE SURE YOU: °· Understand these instructions. °· Will watch your condition. °· Will get help right away if you are not doing well or get worse. °  °This information is not intended to replace advice given to you by your health care provider. Make sure you discuss any questions you have with your health care provider. °  °Document Released: 08/07/2005 Document Revised: 07/13/2014 Document Reviewed: 04/18/2013 °Elsevier Interactive Patient Education ©2016 [employer] ° °

## 2015-04-24 ENCOUNTER — Ambulatory Visit: Payer: Self-pay | Admitting: Neurology

## 2015-04-30 ENCOUNTER — Encounter: Payer: Self-pay | Admitting: Neurology

## 2015-04-30 ENCOUNTER — Encounter: Payer: BLUE CROSS/BLUE SHIELD | Admitting: Adult Health

## 2015-04-30 NOTE — Progress Notes (Signed)
This encounter was created in error - please disregard.  This encounter was created in error - please disregard.

## 2015-04-30 NOTE — Progress Notes (Deleted)
[facility]    Provider:  Dr Lucia GaskinsAhern Referring Provider: Frederica KusterMiller, Stephen M, MD Primary Care Physician:  Frederica KusterMILLER, STEPHEN M, MD  CC:  ***  HPI:  [name] is a 34 y.o. female here as a referral from Dr. Hyacinth MeekerMiller for ***.  ***  Reviewed notes, labs and imaging from outside physicians, which showed ***  Review of Systems: Patient complains of symptoms per HPI as well as the following symptoms ***. Pertinent negatives per HPI. All others negative.   Social History   Social History  . Marital Status: Single    Spouse Name: N/A  . Number of Children: 0  . Years of Education: College   Occupational History  . Not on file.   Social History Main Topics  . Smoking status: Current Every Day Smoker -- 1.00 packs/day    Types: Cigarettes  . Smokeless tobacco: Never Used  . Alcohol Use: No  . Drug Use: Yes     Comment: "pills and heroin"  . Sexual Activity: Not on file   Other Topics Concern  . Not on file   Social History Narrative   Patient is single.   Patient is working full-time.   Patient has a college education.   Patient is right-handed.   Patient drinks 7-8 cups of tea or soda per day    Family History  Problem Relation Age of Onset  . Hyperlipidemia Mother   . Hypertension Mother   . Hyperlipidemia Father   . Hypertension Father   . Diabetes Father   . Heart disease Father   . Depression Father   . Seizures Brother 34    drug and alocohol use  . Alcohol abuse Brother   . Cancer Maternal Grandmother     colon  . Cancer Paternal Grandmother     colon    Past Medical History  Diagnosis Date  . Interstitial cystitis   . Chronic pain   . Hypertension     Past Surgical History  Procedure Laterality Date  . Cholecystectomy    . Bladder stimulater    . Bladder surgery      Current Outpatient Prescriptions  Medication Sig Dispense Refill  . aspirin-acetaminophen-caffeine (EXCEDRIN MIGRAINE) 250-250-65 MG per tablet Take 1 tablet  by mouth every 6 (six) hours as needed for headache or migraine.     . citalopram (CELEXA) 10 MG tablet Take 20 mg by mouth daily.     Marland Kitchen. doxepin (SINEQUAN) 10 MG capsule Take 10 mg by mouth at bedtime.    . fexofenadine (ALLEGRA) 180 MG tablet Take 180 mg by mouth daily.    Marland Kitchen. gabapentin (NEURONTIN) 800 MG tablet Take 800 mg by mouth 3 (three) times daily.     . medroxyPROGESTERone (DEPO-PROVERA) 150 MG/ML injection Inject 150 mg into the muscle every 3 (three) months.     Marland Kitchen. omeprazole (PRILOSEC) 40 MG capsule Take 40 mg by mouth daily.    . prazosin (MINIPRESS) 1 MG capsule Take 3 mg by mouth at bedtime.     . SUBOXONE 8-2 MG FILM Take 1 each by mouth 3 (three) times daily. 1/2 tablet three times daily  0   No current facility-administered medications for this visit.    Allergies as of 04/30/2015 - Review Complete 04/30/2015  Allergen Reaction Noted  . Tequin [gatifloxacin] Anaphylaxis 06/14/2012  . Adhesive [tape] Itching and Rash 06/14/2012  . Latex Rash 08/03/2013    Vitals: BP 129/72 mmHg  Pulse 92  Ht 5\' 2"  (1.575  m)  Wt 154 lb 6.4 oz (70.035 kg)  BMI 28.23 kg/m2 Last Weight:  Wt Readings from Last 1 Encounters:  04/30/15 154 lb 6.4 oz (70.035 kg)   Last Height:   Ht Readings from Last 1 Encounters:  04/30/15  (1.575 m)         Assessment/Plan:    [name], MD  [facility] [address] [place], [place] [address]  Phone [phone] Fax [phone]  A total of *** minutes was spent face-to-face with this patient. Over half this time was spent on counseling patient on the *** diagnosis and different diagnostic and therapeutic options available.

## 2015-05-27 ENCOUNTER — Ambulatory Visit (INDEPENDENT_AMBULATORY_CARE_PROVIDER_SITE_OTHER): Payer: BLUE CROSS/BLUE SHIELD | Admitting: Neurology

## 2015-05-27 ENCOUNTER — Encounter: Payer: Self-pay | Admitting: Neurology

## 2015-05-27 VITALS — BP 112/80 | HR 86 | Resp 16 | Ht 62.0 in | Wt 154.0 lb

## 2015-05-27 DIAGNOSIS — R569 Unspecified convulsions: Secondary | ICD-10-CM | POA: Diagnosis not present
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
## 2015-05-27 NOTE — Patient Instructions (Signed)
1. Schedule 48-hour EEG 2. Continue follow-up with psychiatry and therapist 3. Follow-up after EEG  Seizure Precautions: 1. If medication has been prescribed for you to prevent seizures, take it exactly as directed.  Do not stop taking the medicine without talking to your doctor first, even if you have not had a seizure in a long time.   2. Avoid activities in which a seizure would cause danger to yourself or to others.  Don't operate dangerous machinery, swim alone, or climb in high or dangerous places, such as on ladders, roofs, or girders.  Do not drive unless your doctor says you may.  3. If you have any warning that you may have a seizure, lay down in a safe place where you can't hurt yourself.    4.  No driving for 6 months from last seizure, as [place].   Please refer to the following link on the Epilepsy Foundation of America's website for more information: http://www.epilepsyfoundation.org/answerplace/Social/driving/drivingu.cfm   5.  Maintain good sleep hygiene. Avoid Xanax, alcohol, or any illicit substances.  6.  Notify your neurology if you are planning pregnancy or if you become pregnant.  7.  Contact your doctor if you have any problems that may be related to the medicine you are taking.  8.  Call 911 and bring the patient back to the ED if:        A.  The seizure lasts longer than 5 minutes.       B.  The patient doesn't awaken shortly after the seizure  C.  The patient has new problems such as difficulty seeing, speaking or moving  D.  The patient was injured during the seizure  E.  The patient has a temperature over 102 F (39C)  F.  The patient vomited and now is having trouble breathing

## 2015-06-06 DIAGNOSIS — R569 Unspecified convulsions: Secondary | ICD-10-CM

## 2015-06-06 HISTORY — DX: Unspecified convulsions: R56.9

## 2015-06-09 ENCOUNTER — Ambulatory Visit (INDEPENDENT_AMBULATORY_CARE_PROVIDER_SITE_OTHER): Payer: BLUE CROSS/BLUE SHIELD | Admitting: Family Medicine

## 2015-06-09 VITALS — BP 130/72 | HR 85 | Temp 98.5°F | Resp 18 | Ht 62.0 in | Wt 154.8 lb

## 2015-06-09 DIAGNOSIS — R059 Cough, unspecified: Secondary | ICD-10-CM

## 2015-06-09 DIAGNOSIS — R05 Cough: Secondary | ICD-10-CM | POA: Diagnosis not present

## 2015-06-09 DIAGNOSIS — J029 Acute pharyngitis, unspecified: Secondary | ICD-10-CM

## 2015-06-09 MED ORDER — ALBUTEROL SULFATE 108 (90 BASE) MCG/ACT IN AEPB: 2.0000 | INHALATION_SPRAY | Freq: Four times a day (QID) | RESPIRATORY_TRACT | Status: DC | PRN

## 2015-06-09 MED ORDER — FLUCONAZOLE 150 MG PO TABS: 150.0000 mg | ORAL_TABLET | Freq: Once | ORAL | Status: DC

## 2015-06-09 MED ORDER — AMOXICILLIN-POT CLAVULANATE 875-125 MG PO TABS: 1.0000 | ORAL_TABLET | Freq: Two times a day (BID) | ORAL | Status: DC

## 2015-06-09 NOTE — Patient Instructions (Signed)

## 2015-06-09 NOTE — Progress Notes (Signed)
 @ By signing my name below, I, Raven Small, attest that this documentation has been prepared under the direction and in the presence of Elvina Sidle, MD.  Electronically Signed: Andrew Au, ED Scribe. 06/09/2015. 4:16 PM.  Patient ID: Stacy Mejia MRN: [id number], DOB: 03-26-1981, 34 y.o. Date of Encounter: 06/09/2015, 4:16 PM  Primary Physician: Frederica Kuster, MD  Chief Complaint:  Chief Complaint  Patient presents with  . Sore Throat    x 1 wek  . Cough    green phlegm  . Ear Pain    HPI: 34 y.o. year old female with history below presents with sore throat for 1 week. Pt reports symptoms started with a sore throat that has progressively worsened with otalgia, initially bilateral but now just right and a productive cough consisting of phlegm. Pt has tried chloraseptic spray without relief to sore throat. Pt states that she is unable to take Hycodan. Pt is a heavy smoker and has cut back on smoking. No hx of asthma.  Pt works as a Lawyer   Past Medical History  Diagnosis Date  . Interstitial cystitis   . Chronic pain   . Allergy   . Anxiety   . Depression   . Seizures (HCC)   . Substance abuse      Home Meds: Prior to Admission medications   Medication Sig Start Date End Date Taking? Authorizing Provider  Buprenorphine HCl-Naloxone HCl (ZUBSOLV) 5.7-1.4 MG SUBL Place under the tongue 3 (three) times daily.   Yes Historical Provider, MD  citalopram (CELEXA) 10 MG tablet Take 20 mg by mouth daily.    Yes Historical Provider, MD  doxepin (SINEQUAN) 10 MG capsule Take 10 mg by mouth at bedtime.   Yes Historical Provider, MD  fexofenadine (ALLEGRA) 180 MG tablet Take 180 mg by mouth daily.   Yes Historical Provider, MD  flavoxATE (URISPAS) 100 MG tablet Take 100 mg by mouth 3 (three) times daily as needed for bladder spasms.   Yes Historical Provider, MD  hydrOXYzine (ATARAX/VISTARIL) 25 MG tablet Take 25 mg by mouth 3 (three) times daily as needed.   Yes Historical  Provider, MD  omeprazole (PRILOSEC) 40 MG capsule Take 40 mg by mouth daily.   Yes Historical Provider, MD  prazosin (MINIPRESS) 1 MG capsule Take 3 mg by mouth at bedtime.    Yes Historical Provider, MD  traMADol (ULTRAM) 50 MG tablet Take 50 mg by mouth every 6 (six) hours as needed.  05/10/15  Yes Historical Provider, MD  aspirin-acetaminophen-caffeine (EXCEDRIN MIGRAINE) [phone] MG per tablet Take 1 tablet by mouth every 6 (six) hours as needed for headache or migraine.     Historical Provider, MD  medroxyPROGESTERone (DEPO-PROVERA) 150 MG/ML injection Inject 150 mg into the muscle every 3 (three) months.     Historical Provider, MD    Allergies:  Allergies  Allergen Reactions  . Tequin [Gatifloxacin] Anaphylaxis  . Adhesive [Tape] Itching and Rash  . Latex Rash    Social History   Social History  . Marital Status: Single    Spouse Name: N/A  . Number of Children: 0  . Years of Education: College   Occupational History  . Not on file.   Social History Main Topics  . Smoking status: Current Every Day Smoker -- 1.00 packs/day    Types: Cigarettes  . Smokeless tobacco: Never Used  . Alcohol Use: No  . Drug Use: Yes     Comment: "pills and heroin"  . Sexual Activity: Not  on file   Other Topics Concern  . Not on file   Social History Narrative   Patient is single.   Patient is working full-time.   Patient has a college education.   Patient is right-handed.   Patient drinks 7-8 cups of tea or soda per day     Review of Systems: Constitutional: negative for chills, fever, night sweats, weight changes, or fatigue  HEENT: negative for vision changes, hearing loss,  rhinorrhea, ST, epistaxis, or sinus pressure Cardiovascular: negative for chest pain or palpitations Respiratory: negative for hemoptysis, wheezing, shortness of breath. Abdominal: negative for abdominal pain, nausea, vomiting, diarrhea, or constipation Dermatological: negative for rash Neurologic:  negative for headache, dizziness, or syncope All other systems reviewed and are otherwise negative with the exception to those above and in the HPI.   Physical Exam: Blood pressure 130/72, pulse 85, temperature 98.5 F (36.9 C), temperature source Oral, resp. rate 18, height 5\' 2"  (1.575 m), weight 154 lb 12.8 oz (70.217 kg), SpO2 97 %., Body mass index is 28.31 kg/(m^2). General: Well developed, well nourished, in no acute distress. Head: Normocephalic, atraumatic, eyes without discharge, sclera non-icteric, nares are without discharge. Bilateral auditory canals clear, TM's are without perforation, pearly grey and translucent with reflective cone of light bilaterally. Oral cavity moist, posterior pharynx with erythema and thick posterior nasal drainage.  Neck: Supple. No thyromegaly. Full ROM. No lymphadenopathy. Lungs: Clear bilaterally to auscultation without wheezes, rales, or rhonchi. Breathing is unlabored. Heart: RRR with S1 S2. No murmurs, rubs, or gallops appreciated. Msk:  Strength and tone normal for age. Extremities/Skin: Warm and dry. No clubbing or cyanosis. No edema. No rashes or suspicious lesions. Neuro: Alert and oriented X 3. Moves all extremities spontaneously. Gait is normal. CNII-XII grossly in tact. Psych:  Responds to questions appropriately with a normal affect.    ASSESSMENT AND PLAN:  34 y.o. year old female with bad sore throat and cough.  This chart was scribed in my presence and reviewed by me personally.     ICD-9-CM ICD-10-CM   1. Acute pharyngitis, unspecified etiology 462 J02.9 amoxicillin-clavulanate (AUGMENTIN) 875-125 MG tablet     fluconazole (DIFLUCAN) 150 MG tablet  2. Cough 786.2 R05 Albuterol Sulfate (PROAIR RESPICLICK) 108 (90 BASE) MCG/ACT AEPB     Signed, Elvina SidleKurt Korey Prashad, MD 06/09/2015 4:16 PM

## 2015-06-11 LAB — CULTURE, GROUP A STREP: Organism ID, Bacteria: NORMAL

## 2015-06-19 ENCOUNTER — Ambulatory Visit (INDEPENDENT_AMBULATORY_CARE_PROVIDER_SITE_OTHER): Payer: BLUE CROSS/BLUE SHIELD | Admitting: Neurology

## 2015-06-19 DIAGNOSIS — R569 Unspecified convulsions: Secondary | ICD-10-CM

## 2015-06-24 ENCOUNTER — Other Ambulatory Visit: Payer: Self-pay

## 2015-06-28 ENCOUNTER — Telehealth: Payer: Self-pay | Admitting: Neurology

## 2015-06-28 DIAGNOSIS — G40309 Generalized idiopathic epilepsy and epileptic syndromes, not intractable, without status epilepticus: Secondary | ICD-10-CM

## 2015-06-28 MED ORDER — LAMOTRIGINE ER 25 MG PO TB24: ORAL_TABLET | ORAL | Status: DC

## 2015-06-28 NOTE — Telephone Encounter (Signed)
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 Discussed starting Lamictal, including side effects such as Levonne SpillerStevens Johnson syndrome. She will start Lamotrigine ER 25mg  1 tab daily x 2 weeks, then increase to 2 tabs daily x 2 weeks, then increase to 4 tabs daily. She is to set up a f/u appt in 6 weeks.

## 2015-07-09 NOTE — Procedures (Signed)
ELECTROENCEPHALOGRAM REPORT  Dates of Recording: 06/19/2015 to 06/21/2015  Patient's Name: Stacy Mejia MRN: [id number] Date of Birth: 01-Jun-1981  Referring Provider: Dr. Patrcia DollyKaren Aquino  Procedure: 48-hour ambulatory EEG  History: This is a 35 year old woman with body jerks, twitching, and episodes of staring and unresponsiveness with oral automatisms.  Medications: Celexa, Sinequan, Minipres, Allegra, Prilosec, Excedrin  Technical Summary: This is a 48-hour multichannel digital EEG recording measured by the international 10-20 system with electrodes applied with paste and impedances below 5000 ohms performed as portable with EKG monitoring.  The digital EEG was referentially recorded, reformatted, and digitally filtered in a variety of bipolar and referential montages for optimal display.    DESCRIPTION OF RECORDING: During maximal wakefulness, the background activity consisted of a symmetric 9-9.5 Hz posterior dominant rhythm which was reactive to eye opening. The record is symmetric. There were occasional bursts of generalized irregular 3-4 Hz high voltage spike and wave discharges lasting 0.5 to 1 second.   During the recording, the patient progresses through wakefulness, drowsiness, and Stage 2 sleep.  Similar bursts of generalized high voltage 3-4 Hz spike and polyspike and wave discharges are seen with slightly increased frequency during sleep.  Events: On 12/14 at 1530 hours, patient reports body jerking. There were no EEG or EKG changes seen at during this time period.  There were no electrographic seizures seen.  EKG lead was unremarkable.  IMPRESSION: This 48-hour ambulatory EEG study is abnormal due to occasional bursts of generalized 3-4 Hz irregular spike and polyspike and wave discharges.  CLINICAL CORRELATION of the above findings is consistent with a primary generalized epilepsy. Report of body jerking did not show any electrographic correlate.    Patrcia DollyKaren Aquino,  M.D.

## 2015-07-15 ENCOUNTER — Telehealth: Payer: Self-pay | Admitting: Neurology

## 2015-07-15 MED ORDER — ONDANSETRON HCL 4 MG PO TABS: 4.0000 mg | ORAL_TABLET | Freq: Three times a day (TID) | ORAL | Status: DC | PRN

## 2015-07-15 NOTE — Telephone Encounter (Signed)
Pt wants to talk to someone about her having a really bad seizures please call [phone]

## 2015-07-15 NOTE — Telephone Encounter (Signed)
Noted.  Continue Lamictal 50mg  daily for two weeks (from last Friday), then increase to 100mg  - (4) 25mg  tablets daily as instructed previously by Dr. Karel JarvisAquino.  If seizures cluster or last longer than 2 minutes, she needs to go to nearest ER. Zofran for nausea has been sent to her pharmacy, please inform patient.  Stacy Gravlin K. Allena KatzPatel, DO

## 2015-07-15 NOTE — Telephone Encounter (Signed)
I spoke with patient. She states that she had a "large" seizure on Friday night. She states that she was in her living room when she fell & hit her head on a piece of furniture. She states she doesn't remember much else after that. She states that she must've fell again in the kitchen because she woke up on her back on the kitchen floor. She states she doesn't know how long she had been on the kitchen floor, but thinks it was at least an hour because she states her father had told her he had been trying to reach her by phone for about an hour. She is taking Lamictal 25 mg which she increased to 2 tablets daily on Friday. She states she hasn't missed any medication doses or hasn't had any sleep deprivation. She states that she hasn't had any more episodes since Friday and that she is feeling ok, but is having nausea. She wants a Rx for the nausea.

## 2015-07-15 NOTE — Telephone Encounter (Signed)
Spoke with patient again and notified of her advisement and new Rx.

## 2015-07-31 ENCOUNTER — Other Ambulatory Visit: Payer: Self-pay | Admitting: Neurology

## 2015-08-01 NOTE — Telephone Encounter (Signed)
Do you want to refill this or should I ask Dr. Karel Jarvis?

## 2015-08-01 NOTE — Telephone Encounter (Signed)
This is an Aquino patient.  

## 2015-08-07 ENCOUNTER — Ambulatory Visit (INDEPENDENT_AMBULATORY_CARE_PROVIDER_SITE_OTHER): Payer: BLUE CROSS/BLUE SHIELD | Admitting: Family Medicine

## 2015-08-07 VITALS — BP 102/60 | HR 107 | Temp 97.7°F | Resp 16 | Ht 62.0 in | Wt 169.8 lb

## 2015-08-07 DIAGNOSIS — J029 Acute pharyngitis, unspecified: Secondary | ICD-10-CM | POA: Diagnosis not present

## 2015-08-07 DIAGNOSIS — K047 Periapical abscess without sinus: Secondary | ICD-10-CM

## 2015-08-07 MED ORDER — AMOXICILLIN-POT CLAVULANATE 875-125 MG PO TABS: 1.0000 | ORAL_TABLET | Freq: Two times a day (BID) | ORAL | Status: DC

## 2015-08-07 NOTE — Patient Instructions (Signed)

## 2015-08-07 NOTE — Progress Notes (Signed)
   Subjective:    Patient ID: Stacy Mejia, female    DOB: 10/18/1980, 35 y.o.   MRN: [id number] By signing my name below, I, Stacy Mejia, attest that this documentation has been prepared under the direction and in the presence of Elvina Sidle, MD.  Electronically Signed: Littie Mejia, Medical Scribe. 08/07/2015. 1:09 PM.  HPI HPI Comments: Stacy Mejia is a 35 y.o. female who presents to the [facility] complaining of gradual onset, left lower dental pain. Patient states the tooth has abscessed. She has associated jaw swelling. She has not had her wisdom teeth extracted. Patient cannot tolerate narcotic pain medications as she previously had a problem with drug addiction. She states she needs to have the tooth extracted, but she cannot afford it.  Patient is not currently working because she was recently diagnosed with epilepsy this past December. She is currently seeing a neurologist and is being treated with Lamictal.   Review of Systems  HENT: Positive for dental problem and facial swelling.        Objective:   Physical Exam CONSTITUTIONAL: Well developed/well nourished HEAD: Normocephalic/atraumatic. Multiple dental caries. Jaw mildly swollen on left. EYES: EOM/PERRL ENMT: Mucous membranes moist NECK: supple no meningeal signs SPINE: entire spine nontender CV: S1/S2 noted, no murmurs/rubs/gallops noted LUNGS: Lungs are clear to auscultation bilaterally, no apparent distress ABDOMEN: soft, nontender, no rebound or guarding GU: no cva tenderness NEURO: Pt is awake/alert, moves all extremitiesx4 EXTREMITIES: pulses normal, full ROM SKIN: warm, color normal PSYCH: no abnormalities of mood noted        Assessment & Plan:   This chart was scribed in my presence and reviewed by me personally.    ICD-9-CM ICD-10-CM   1. Dental abscess 522.5 K04.7 amoxicillin-clavulanate (AUGMENTIN) 875-125 MG tablet  2. Acute pharyngitis, unspecified etiology 462 J02.9        Signed, Elvina Sidle, MD

## 2015-08-20 ENCOUNTER — Ambulatory Visit (INDEPENDENT_AMBULATORY_CARE_PROVIDER_SITE_OTHER): Payer: BLUE CROSS/BLUE SHIELD | Admitting: Neurology

## 2015-08-20 ENCOUNTER — Encounter: Payer: Self-pay | Admitting: Neurology

## 2015-08-20 VITALS — BP 100/70 | HR 78 | Ht 62.0 in | Wt 164.0 lb

## 2015-08-20 DIAGNOSIS — G40309 Generalized idiopathic epilepsy and epileptic syndromes, not intractable, without status epilepticus: Secondary | ICD-10-CM | POA: Insufficient documentation

## 2015-08-20 MED ORDER — LAMOTRIGINE 100 MG PO TABS: ORAL_TABLET | ORAL | Status: DC
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
## 2015-08-20 NOTE — Patient Instructions (Signed)
1. Finish off your current prescription. Your new bottle will be for Lamotrigine : Take 1 tablet twice a day 2. Discuss Tramadol with your urologist, would minimize use, or avoid if possible 3. As per [place] driving laws, no driving until 6 months seizure-free 4. Follow-up in 7 months, call for any changes  Seizure Precautions: 1. If medication has been prescribed for you to prevent seizures, take it exactly as directed.  Do not stop taking the medicine without talking to your doctor first, even if you have not had a seizure in a long time.   2. Avoid activities in which a seizure would cause danger to yourself or to others.  Don't operate dangerous machinery, swim alone, or climb in high or dangerous places, such as on ladders, roofs, or girders.  Do not drive unless your doctor says you may.  3. If you have any warning that you may have a seizure, lay down in a safe place where you can't hurt yourself.    4.  No driving for 6 months from last seizure, as per [facility].   Please refer to the following link on the Epilepsy Foundation of America's website for more information: http://www.epilepsyfoundation.org/answerplace/Social/driving/drivingu.cfm   5.  Maintain good sleep hygiene. Avoid alcohol  6.  Notify your neurology if you are planning pregnancy or if you become pregnant.  7.  Contact your doctor if you have any problems that may be related to the medicine you are taking.  8.  Call 911 and bring the patient back to the ED if:        A.  The seizure lasts longer than 5 minutes.       B.  The patient doesn't awaken shortly after the seizure  C.  The patient has new problems such as difficulty seeing, speaking or moving  D.  The patient was injured during the seizure  E.  The patient has a temperature over 102 F (39C)  F.  The patient vomited and now is having trouble breathing

## 2015-08-28 ENCOUNTER — Other Ambulatory Visit: Payer: Self-pay | Admitting: Neurology

## 2015-08-29 ENCOUNTER — Other Ambulatory Visit: Payer: Self-pay | Admitting: Family Medicine

## 2015-08-30 ENCOUNTER — Ambulatory Visit (INDEPENDENT_AMBULATORY_CARE_PROVIDER_SITE_OTHER): Payer: BLUE CROSS/BLUE SHIELD | Admitting: Physician Assistant

## 2015-08-30 VITALS — BP 110/76 | HR 84 | Temp 98.9°F | Resp 16 | Ht 62.0 in | Wt 165.0 lb

## 2015-08-30 DIAGNOSIS — K029 Dental caries, unspecified: Secondary | ICD-10-CM | POA: Diagnosis not present

## 2015-08-30 MED ORDER — CHLORHEXIDINE GLUCONATE 0.12 % MT SOLN: 15.0000 mL | Freq: Two times a day (BID) | OROMUCOSAL | Status: DC

## 2015-08-30 MED ORDER — AMOXICILLIN-POT CLAVULANATE 875-125 MG PO TABS
1.0000 | ORAL_TABLET | Freq: Two times a day (BID) | ORAL | Status: AC
Start: 2015-08-30 — End: 2015-09-09

## 2015-08-30 NOTE — Patient Instructions (Addendum)
Brush and floss your teeth 2-3 times daily. Get the dental visit as soon as you can. Some dentists will work out a payment agreement with you.  Drs. Gwendlyn Deutscher and [facility] [phone] are local oral surgeons that may be able to help you with removal of the offending teeth.
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
## 2015-09-02 ENCOUNTER — Telehealth: Payer: Self-pay | Admitting: Family Medicine

## 2015-09-02 NOTE — Telephone Encounter (Signed)
Called patient after faxed received from after hours call center of patient requesting refill of zofran. Left msg on patients vm that Dr. Karel Jarvis would like for her to talk with her pcp about Rx for nausea if she feel likes she needs to be on something continuous. The Rx that was given to her in Jan was meant to be a 1 time only Rx. Asked her to call me back if she had any further questions.

## 2015-12-19 ENCOUNTER — Ambulatory Visit (INDEPENDENT_AMBULATORY_CARE_PROVIDER_SITE_OTHER): Payer: BLUE CROSS/BLUE SHIELD | Admitting: Physician Assistant

## 2015-12-19 VITALS — BP 122/80 | HR 98 | Temp 97.5°F | Resp 16 | Ht 62.0 in | Wt 163.0 lb

## 2015-12-19 DIAGNOSIS — F32A Depression, unspecified: Secondary | ICD-10-CM

## 2015-12-19 DIAGNOSIS — Z23 Encounter for immunization: Secondary | ICD-10-CM | POA: Diagnosis not present

## 2015-12-19 DIAGNOSIS — J189 Pneumonia, unspecified organism: Secondary | ICD-10-CM | POA: Diagnosis not present

## 2015-12-19 DIAGNOSIS — F329 Major depressive disorder, single episode, unspecified: Secondary | ICD-10-CM

## 2015-12-19 MED ORDER — BENZONATATE 100 MG PO CAPS: 100.0000 mg | ORAL_CAPSULE | Freq: Three times a day (TID) | ORAL | Status: DC | PRN

## 2015-12-19 MED ORDER — AZITHROMYCIN 250 MG PO TABS: ORAL_TABLET | ORAL | Status: AC

## 2015-12-19 NOTE — Progress Notes (Signed)
[facility] [address] [place] [address] [phone]  Date:  12/19/2015   Name:  Stacy Mejia   DOB:  1981-03-09   MRN:  [id number]  PCP:  No PCP Per Patient    Chief Complaint: Cough; chest congestion; OTHER; and Immunizations   History of Present Illness:  This is a 35 y.o. female with PMH epilepsy, chronic migraine, interstitial cystitis, anxiety, depression, PTSD, who is presenting with 1 week of cough. States started with hoarseness and nasal congestion. She states her cough is getting much worse. She is coughing all day and all throughout the night. Cough is mix of dry and productive. Having some wheezing and sob. States at night "sounds like i'm whistling". No hx asthma. Does have a hx of allergies and takes allegra daily. Has never had a cough this bad before. States she has had intermittent chills and feeling feverish. Has not checked temp. Has not tried anything for her symptoms. She is a current smoker, smokes 1-2 ppd.  She scored 22 on PHQ-9 depression screening today. States "I am very depressed". States she was diagnosed with epilepsy 6 months ago. She was on celexa before, lamictal added to this made her feel like a zombie. She was taken off celexa and her lamictal increased. Her depression is now very uncontrolled. She states she does not feel suicidal but she just feels tired from being depressed all the time. Sees her counselor and psychiatrist both once a month. She does not feel like either of them are helping her. She wants to be tested for borderline and bipolar but her psychiatrist stated she would need to find another practice to get the testing done. She used to be a CNA. Since being diagnosed with epilepsy she has been unemployed.  She is also needing tdap.  Review of Systems:  Review of Systems See HPI  Patient Active Problem List   Diagnosis Date Noted  . 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 Other long term  (current) drug therapy 07/10/2014  . Clinical depression 07/10/2014  . PTSD (post-traumatic stress disorder) 06/15/2014  . Conversion disorder with attacks or seizures, persistent, with psychological stressor 04/17/2014  . Chronic interstitial cystitis 08/12/2011  . Chronic migraine without aura 05/25/2011  . Other specified conditions associated with female genital organs and menstrual cycle 05/25/2011  . 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 OTHER SPEC DISEASES BLOOD&BLOOD-FORMING ORGANS 09/11/2009  . GENERALIZED ANXIETY DISORDER 09/11/2009  . SMOKER 09/11/2009  . ERYTHEMA NODOSUM 09/11/2009  . DIZZINESS 09/11/2009  . PERSONAL HX OTH INFECTIOUS&PARASITIC DISEASE 09/11/2009  . FEVER, HX OF 09/11/2009    Prior to Admission medications   Medication Sig Start Date End Date Taking? Authorizing Provider  ALPRAZolam Prudy Feeler) 1 MG tablet as needed. 08/22/15  Yes Historical Provider, MD  gabapentin (NEURONTIN) 800 MG tablet  08/22/15  Yes Historical Provider, MD  lamoTRIgine (LAMICTAL) 100 MG tablet Take 1 tablet twice a day 08/20/15  Yes Van Clines, MD  medroxyPROGESTERone (DEPO-PROVERA) 150 MG/ML injection Inject 150 mg into the muscle every 3 (three) months.    Yes Historical Provider, MD  NAPROXEN PO Take by mouth.   Yes Historical Provider, MD  omeprazole (PRILOSEC) 40 MG capsule Take 40 mg by mouth daily.   Yes Historical Provider, MD  ondansetron (ZOFRAN) 4 MG tablet TAKE 1 TABLET (4 MG TOTAL) BY MOUTH EVERY 8 (EIGHT) HOURS AS NEEDED FOR NAUSEA OR VOMITING. 08/01/15  Yes Van Clines, MD  pentosan polysulfate (  ELMIRON) 100 MG capsule Take 100 mg by mouth. 08/28/15  Yes Historical Provider, MD  prazosin (MINIPRESS) 1 MG capsule Take 1 mg by mouth 3 (three) times daily.   Yes Historical Provider, MD  propranolol (INDERAL) 40 MG tablet 40 mg. Take 1 tablet daily 08/01/15  Yes Historical Provider, MD    Allergies  Allergen Reactions  . Tequin [Gatifloxacin] Anaphylaxis     Throat closes up  . Adhesive [Tape] Itching and Rash  . Latex Rash    Past Surgical History  Procedure Laterality Date  . Cholecystectomy    . Bladder stimulater    . Bladder surgery    . Tonsillectomy      Social History  Substance Use Topics  . Smoking status: Current Every Day Smoker -- 1.00 packs/day    Types: Cigarettes  . Smokeless tobacco: Never Used  . Alcohol Use: No    Family History  Problem Relation Age of Onset  . Hyperlipidemia Mother   . Hypertension Mother   . Hyperlipidemia Father   . Hypertension Father   . Diabetes Father   . Heart disease Father   . Depression Father   . Seizures Brother 34    drug and alocohol use  . Alcohol abuse Brother   . Cancer Maternal Grandmother     colon  . Cancer Paternal Grandmother     colon    Medication list has been reviewed and updated.  Physical Examination:  Physical Exam  Constitutional: She is oriented to person, place, and time. She appears well-developed and well-nourished. No distress.  HENT:  Head: Normocephalic and atraumatic.  Right Ear: Hearing, tympanic membrane, external ear and ear canal normal.  Left Ear: Hearing, tympanic membrane, external ear and ear canal normal.  Nose: Nose normal.  Mouth/Throat: Uvula is midline, oropharynx is clear and moist and mucous membranes are normal.  Eyes: Conjunctivae and lids are normal. Right eye exhibits no discharge. Left eye exhibits no discharge. No scleral icterus.  Cardiovascular: Normal rate, regular rhythm, normal heart sounds and normal pulses.   No murmur heard. Pulmonary/Chest: Effort normal. No respiratory distress. She has no wheezes. She has no rhonchi. She has rales (subtle rales right lower).  Musculoskeletal: Normal range of motion.  Lymphadenopathy:       Head (right side): No submental, no submandibular and no tonsillar adenopathy present.       Head (left side): No submental, no submandibular and no tonsillar adenopathy present.    She  has no cervical adenopathy.  Neurological: She is alert and oriented to person, place, and time.  Skin: Skin is warm, dry and intact. No lesion and no rash noted.  Psychiatric: She has a normal mood and affect. Her speech is normal and behavior is normal. Thought content normal.   BP 122/80 mmHg  Pulse 98  Temp(Src) 97.5 F (36.4 C) (Oral)  Resp 16  Ht 5\' 2"  (1.575 m)  Wt 163 lb (73.936 kg)  BMI 29.81 kg/m2  SpO2 96%  Assessment and Plan:  1. CAP (community acquired pneumonia) Rales on the right. Treat for CAP with zpak. Return in 1 week if symptoms do not improve or at any time if symptoms worsen.  - benzonatate (TESSALON) 100 MG capsule; Take 1-2 capsules (100-200 mg total) by mouth 3 (three) times daily as needed for cough.  Dispense: 40 capsule; Refill: 0 - azithromycin (ZITHROMAX) 250 MG tablet; Take 2 tabs PO x 1 dose, then 1 tab PO QD x 4 days  Dispense: 6 tablet; Refill: 0  2. Need for Tdap vaccination - Tdap vaccine greater than or equal to 7yo IM  3. Depression Depression that is not improving with monthly psychiatric and counselor sessions. Denies SI/HI. Gave her information for [facility].   Roswell Miners Dyke Brackett, MHS [facility]  12/19/2015

## 2015-12-19 NOTE — Patient Instructions (Addendum)
The [facility] care center [phone]  Take zpak as directed Take tessalon for cough Drink plenty of water and get plenty of rest      IF you received an x-ray today, you will receive an invoice from [facility]. Please contact [facility] at [phone] with questions or concerns regarding your invoice.   IF you received labwork today, you will receive an invoice from [facility]. Please contact [facility] at [phone] with questions or concerns regarding your invoice.   Our billing staff will not be able to assist you with questions regarding bills from these companies.  You will be contacted with the lab results as soon as they are available. The fastest way to get your results is to activate your My Chart account. Instructions are located on the last page of this paperwork. If you have not heard from us regarding the results in 2 weeks, please contact this office.

## 2015-12-20 ENCOUNTER — Telehealth: Payer: Self-pay

## 2015-12-20 DIAGNOSIS — F329 Major depressive disorder, single episode, unspecified: Secondary | ICD-10-CM

## 2015-12-20 DIAGNOSIS — F32A Depression, unspecified: Secondary | ICD-10-CM

## 2015-12-20 NOTE — Telephone Encounter (Signed)
Pt is needing to talk with nicole about a referral to a neuropyschology office   Best number [phone]

## 2015-12-25 NOTE — Telephone Encounter (Signed)
Orders Placed This Encounter  Procedures   Ambulatory referral to Psychiatry    Referral Priority:   Routine    Referral Type:   Psychiatric    Referral Reason:   Specialty Services Required    Requested Specialty:   Psychiatry    Number of Visits Requested:   1    

## 2015-12-25 NOTE — Telephone Encounter (Signed)
Spoke with pt, she states she probably could not see her until the end of July. It takes 3-5 days for a return call. She states she has not heard anything about an appt. She wants to see if a referral will make this process go faster?

## 2016-03-10 ENCOUNTER — Ambulatory Visit: Payer: Self-pay | Admitting: Neurology

## 2016-03-11 ENCOUNTER — Other Ambulatory Visit: Payer: BLUE CROSS/BLUE SHIELD

## 2016-03-11 ENCOUNTER — Encounter: Payer: Self-pay | Admitting: Neurology

## 2016-03-11 ENCOUNTER — Ambulatory Visit (INDEPENDENT_AMBULATORY_CARE_PROVIDER_SITE_OTHER): Payer: BLUE CROSS/BLUE SHIELD | Admitting: Neurology

## 2016-03-11 VITALS — BP 122/70 | HR 85 | Temp 97.8°F | Ht 62.0 in | Wt 174.4 lb

## 2016-03-11 DIAGNOSIS — G2581 Restless legs syndrome: Secondary | ICD-10-CM | POA: Diagnosis not present

## 2016-03-11 DIAGNOSIS — G40309 Generalized idiopathic epilepsy and epileptic syndromes, not intractable, without status epilepticus: Secondary | ICD-10-CM | POA: Diagnosis not present

## 2016-03-11 MED ORDER — LAMOTRIGINE 100 MG PO TABS: ORAL_TABLET | ORAL | 11 refills | Status: DC
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
## 2016-05-12 ENCOUNTER — Other Ambulatory Visit: Payer: Self-pay | Admitting: Neurology

## 2016-05-12 DIAGNOSIS — G40309 Generalized idiopathic epilepsy and epileptic syndromes, not intractable, without status epilepticus: Secondary | ICD-10-CM

## 2016-05-12 NOTE — Telephone Encounter (Signed)
Lamictal RX request refused: request too soon

## 2016-06-16 ENCOUNTER — Ambulatory Visit (INDEPENDENT_AMBULATORY_CARE_PROVIDER_SITE_OTHER): Payer: BLUE CROSS/BLUE SHIELD | Admitting: Neurology

## 2016-06-16 ENCOUNTER — Encounter: Payer: Self-pay | Admitting: Neurology

## 2016-06-16 VITALS — BP 118/70 | HR 97 | Ht 62.0 in | Wt 164.2 lb

## 2016-06-16 DIAGNOSIS — G40309 Generalized idiopathic epilepsy and epileptic syndromes, not intractable, without status epilepticus: Secondary | ICD-10-CM

## 2016-06-16 DIAGNOSIS — G2581 Restless legs syndrome: Secondary | ICD-10-CM

## 2016-06-16 MED ORDER — PRAMIPEXOLE DIHYDROCHLORIDE 0.125 MG PO TABS: ORAL_TABLET | ORAL | 4 refills | Status: DC
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
## 2016-06-18 DIAGNOSIS — G2581 Restless legs syndrome: Secondary | ICD-10-CM | POA: Insufficient documentation

## 2016-06-19 ENCOUNTER — Other Ambulatory Visit: Payer: BLUE CROSS/BLUE SHIELD

## 2016-06-19 DIAGNOSIS — G40309 Generalized idiopathic epilepsy and epileptic syndromes, not intractable, without status epilepticus: Secondary | ICD-10-CM

## 2016-06-19 DIAGNOSIS — G2581 Restless legs syndrome: Secondary | ICD-10-CM

## 2016-06-19 LAB — FERRITIN: Ferritin: 58 ng/mL (ref 10–154)

## 2016-06-21 LAB — LAMOTRIGINE LEVEL: LAMOTRIGINE LVL: 3.5 ug/mL — AB (ref 4.0–18.0)

## 2016-06-22 ENCOUNTER — Telehealth: Payer: Self-pay

## 2016-06-22 MED ORDER — LAMOTRIGINE 200 MG PO TABS: 200.0000 mg | ORAL_TABLET | Freq: Two times a day (BID) | ORAL | 6 refills | Status: DC

## 2016-06-22 NOTE — Telephone Encounter (Signed)
-----   Message from Van ClinesKaren M Aquino, MD sent at 06/22/2016  9:15 AM EST ----- Pls let her know iron level is normal, but her Lamictal level is on the low side. Increase Lamotrigine to 200mg  BID. Pls call in Rx for lamotrigine 200mg  BID, #60 with 6 refills, thanks

## 2016-06-22 NOTE — Telephone Encounter (Signed)
Patient notified RX sent to pharmacy.

## 2016-09-11 ENCOUNTER — Encounter: Payer: Self-pay | Admitting: Neurology

## 2016-09-11 ENCOUNTER — Ambulatory Visit (INDEPENDENT_AMBULATORY_CARE_PROVIDER_SITE_OTHER): Payer: BLUE CROSS/BLUE SHIELD | Admitting: Neurology

## 2016-09-11 VITALS — BP 132/84 | HR 90 | Temp 98.2°F | Resp 18 | Ht 62.0 in | Wt 171.8 lb

## 2016-09-11 DIAGNOSIS — G40309 Generalized idiopathic epilepsy and epileptic syndromes, not intractable, without status epilepticus: Secondary | ICD-10-CM

## 2016-09-11 DIAGNOSIS — G2581 Restless legs syndrome: Secondary | ICD-10-CM

## 2016-09-11 MED ORDER — PRAMIPEXOLE DIHYDROCHLORIDE 0.125 MG PO TABS: ORAL_TABLET | ORAL | 4 refills | Status: DC
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
## 2016-09-15 ENCOUNTER — Other Ambulatory Visit: Payer: BLUE CROSS/BLUE SHIELD

## 2016-11-02 ENCOUNTER — Other Ambulatory Visit: Payer: Self-pay | Admitting: Neurology

## 2016-11-02 DIAGNOSIS — G2581 Restless legs syndrome: Secondary | ICD-10-CM

## 2017-01-13 ENCOUNTER — Ambulatory Visit (INDEPENDENT_AMBULATORY_CARE_PROVIDER_SITE_OTHER): Payer: BLUE CROSS/BLUE SHIELD | Admitting: Neurology

## 2017-01-13 ENCOUNTER — Encounter: Payer: Self-pay | Admitting: Neurology

## 2017-01-13 VITALS — BP 108/70 | HR 78 | Ht 62.0 in | Wt 175.0 lb

## 2017-01-13 DIAGNOSIS — G2581 Restless legs syndrome: Secondary | ICD-10-CM | POA: Diagnosis not present

## 2017-01-13 DIAGNOSIS — G40309 Generalized idiopathic epilepsy and epileptic syndromes, not intractable, without status epilepticus: Secondary | ICD-10-CM | POA: Diagnosis not present

## 2017-01-13 MED ORDER — LAMOTRIGINE 200 MG PO TABS: 200.0000 mg | ORAL_TABLET | Freq: Two times a day (BID) | ORAL | 11 refills | Status: DC

## 2017-01-13 MED ORDER — ROPINIROLE HCL 0.25 MG PO TABS: ORAL_TABLET | ORAL | 6 refills | Status: DC
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
## 2017-01-13 NOTE — Patient Instructions (Addendum)
1. Stop Mirapex. After 1 day off medication, start Requip 0.25mg , take 1 tablet every night (2 hours before bedtime) for 3 days, then increase to 2 tablets every night (2 hours before bedtime) 2. Continue Lamotrigine 200mg  twice a day 3. Check Lamotrigine level  Your provider has requested that you have labwork completed. Please go to [facility] ([id number]) on the second floor of this building before leaving the office today. You do not need to check in. If you are not called within 15 minutes please check with the front desk.   4. Follow-up in 4-5 months  Seizure Precautions: 1. If medication has been prescribed for you to prevent seizures, take it exactly as directed.  Do not stop taking the medicine without talking to your doctor first, even if you have not had a seizure in a long time.   2. Avoid activities in which a seizure would cause danger to yourself or to others.  Don't operate dangerous machinery, swim alone, or climb in high or dangerous places, such as on ladders, roofs, or girders.  Do not drive unless your doctor says you may.  3. If you have any warning that you may have a seizure, lay down in a safe place where you can't hurt yourself.    4.  No driving for 6 months from last seizure, as per [facility] [place].   Please refer to the following link on the [employer]'s website for more information: http://www.epilepsyfoundation.org/answerplace/Social/driving/drivingu.cfm   5.  Maintain good sleep hygiene. Avoid alcohol.  6.  Notify your neurology if you are planning pregnancy or if you become pregnant.  7.  Contact your doctor if you have any problems that may be related to the medicine you are taking.  8.  Call 911 and bring the patient back to the ED if:        A.  The seizure lasts longer than 5 minutes.       B.  The patient doesn't awaken shortly after the seizure  C.  The patient has new problems such as difficulty seeing, speaking  or moving  D.  The patient was injured during the seizure  E.  The patient has a temperature over 102 F (39C)  F.  The patient vomited and now is having trouble breathing

## 2017-01-15 ENCOUNTER — Ambulatory Visit: Payer: Self-pay | Admitting: Neurology

## 2017-02-10 ENCOUNTER — Other Ambulatory Visit: Payer: BLUE CROSS/BLUE SHIELD

## 2017-02-10 DIAGNOSIS — G40309 Generalized idiopathic epilepsy and epileptic syndromes, not intractable, without status epilepticus: Secondary | ICD-10-CM

## 2017-02-15 LAB — LAMOTRIGINE LEVEL: Lamotrigine Lvl: 3.3 ug/mL — ABNORMAL LOW (ref 4.0–18.0)

## 2017-06-28 ENCOUNTER — Encounter: Payer: Self-pay | Admitting: Neurology

## 2017-06-28 ENCOUNTER — Ambulatory Visit (INDEPENDENT_AMBULATORY_CARE_PROVIDER_SITE_OTHER): Payer: BLUE CROSS/BLUE SHIELD | Admitting: Neurology

## 2017-06-28 VITALS — BP 114/78 | HR 77 | Ht 62.0 in | Wt 169.0 lb

## 2017-06-28 DIAGNOSIS — R251 Tremor, unspecified: Secondary | ICD-10-CM | POA: Diagnosis not present

## 2017-06-28 DIAGNOSIS — G2581 Restless legs syndrome: Secondary | ICD-10-CM | POA: Diagnosis not present

## 2017-06-28 DIAGNOSIS — G40309 Generalized idiopathic epilepsy and epileptic syndromes, not intractable, without status epilepticus: Secondary | ICD-10-CM

## 2017-06-28 MED ORDER — LAMOTRIGINE 200 MG PO TABS: 200.0000 mg | ORAL_TABLET | Freq: Two times a day (BID) | ORAL | 11 refills | Status: DC

## 2017-06-28 MED ORDER — ROPINIROLE HCL 0.25 MG PO TABS: ORAL_TABLET | ORAL | 6 refills | Status: DC

## 2017-06-28 MED ORDER — PROPRANOLOL HCL 40 MG PO TABS: ORAL_TABLET | ORAL | 6 refills | Status: DC

## 2017-06-28 NOTE — Patient Instructions (Signed)
1. Increase Requip 0.25mg : Take 3 tablets 2 hours before bedtime 2. Continue Lamictal 200mg  twice a day 3. Continue Propranolol 40mg  daily 4. Please keep in touch with your therapist. If things start getting harder to deal with, go to ER for psychiatric help 5. Follow-up in 6 months, call for any changes  Seizure Precautions: 1. If medication has been prescribed for you to prevent seizures, take it exactly as directed.  Do not stop taking the medicine without talking to your doctor first, even if you have not had a seizure in a long time.   2. Avoid activities in which a seizure would cause danger to yourself or to others.  Don't operate dangerous machinery, swim alone, or climb in high or dangerous places, such as on ladders, roofs, or girders.  Do not drive unless your doctor says you may.  3. If you have any warning that you may have a seizure, lay down in a safe place where you can't hurt yourself.    4.  No driving for 6 months from last seizure, as per [facility] [place].   Please refer to the following link on the [employer]'s website for more information: http://www.epilepsyfoundation.org/answerplace/Social/driving/drivingu.cfm   5.  Maintain good sleep hygiene. Avoid alcohol.  6.  Notify your neurology if you are planning pregnancy or if you become pregnant.  7.  Contact your doctor if you have any problems that may be related to the medicine you are taking.  8.  Call 911 and bring the patient back to the ED if:        A.  The seizure lasts longer than 5 minutes.       B.  The patient doesn't awaken shortly after the seizure  C.  The patient has new problems such as difficulty seeing, speaking or moving  D.  The patient was injured during the seizure  E.  The patient has a temperature over 102 F (39C)  F.  The patient vomited and now is having trouble breathing
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
## 2017-07-17 ENCOUNTER — Other Ambulatory Visit: Payer: Self-pay | Admitting: Neurology

## 2017-07-17 DIAGNOSIS — G2581 Restless legs syndrome: Secondary | ICD-10-CM

## 2017-11-19 ENCOUNTER — Telehealth: Payer: Self-pay

## 2017-11-19 NOTE — Telephone Encounter (Signed)
LMOM asking pt to return call - attempting to reschedule appointment FROM Friday June 7th at 8:30 TO Thursday June 6th at 8:30

## 2017-12-09 ENCOUNTER — Other Ambulatory Visit: Payer: Self-pay

## 2017-12-09 ENCOUNTER — Ambulatory Visit (INDEPENDENT_AMBULATORY_CARE_PROVIDER_SITE_OTHER): Payer: BLUE CROSS/BLUE SHIELD | Admitting: Neurology

## 2017-12-09 ENCOUNTER — Encounter: Payer: Self-pay | Admitting: Neurology

## 2017-12-09 VITALS — BP 114/80 | HR 88 | Ht 62.0 in | Wt 199.0 lb

## 2017-12-09 DIAGNOSIS — R251 Tremor, unspecified: Secondary | ICD-10-CM

## 2017-12-09 DIAGNOSIS — G43009 Migraine without aura, not intractable, without status migrainosus: Secondary | ICD-10-CM | POA: Diagnosis not present

## 2017-12-09 DIAGNOSIS — G40309 Generalized idiopathic epilepsy and epileptic syndromes, not intractable, without status epilepticus: Secondary | ICD-10-CM | POA: Diagnosis not present

## 2017-12-09 DIAGNOSIS — G2581 Restless legs syndrome: Secondary | ICD-10-CM | POA: Diagnosis not present

## 2017-12-09 MED ORDER — PROPRANOLOL HCL 40 MG PO TABS: ORAL_TABLET | ORAL | 11 refills | Status: DC

## 2017-12-09 MED ORDER — ROPINIROLE HCL 1 MG PO TABS: ORAL_TABLET | ORAL | 11 refills | Status: DC

## 2017-12-09 MED ORDER — SUMATRIPTAN SUCCINATE 25 MG PO TABS: ORAL_TABLET | ORAL | 5 refills | Status: DC

## 2017-12-09 MED ORDER — LAMOTRIGINE 200 MG PO TABS: 200.0000 mg | ORAL_TABLET | Freq: Two times a day (BID) | ORAL | 11 refills | Status: DC
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
## 2017-12-10 ENCOUNTER — Ambulatory Visit: Payer: Self-pay | Admitting: Neurology

## 2017-12-22 ENCOUNTER — Other Ambulatory Visit (HOSPITAL_COMMUNITY): Payer: Self-pay | Admitting: Orthopedic Surgery

## 2017-12-22 DIAGNOSIS — M5416 Radiculopathy, lumbar region: Secondary | ICD-10-CM

## 2017-12-28 ENCOUNTER — Other Ambulatory Visit: Payer: Self-pay | Admitting: Orthopedic Surgery

## 2017-12-28 DIAGNOSIS — M5416 Radiculopathy, lumbar region: Secondary | ICD-10-CM

## 2018-01-10 ENCOUNTER — Ambulatory Visit
Admission: RE | Admit: 2018-01-10 | Discharge: 2018-01-10 | Disposition: A | Discharge status: Home / Self Care | Payer: BLUE CROSS/BLUE SHIELD | Source: Ambulatory Visit | Attending: Orthopedic Surgery | Admitting: Orthopedic Surgery

## 2018-01-10 DIAGNOSIS — M5416 Radiculopathy, lumbar region: Secondary | ICD-10-CM

## 2018-01-10 MED ORDER — DIAZEPAM 5 MG PO TABS: 10.0000 mg | ORAL_TABLET | Freq: Once | ORAL | Status: DC

## 2018-01-10 MED ORDER — MEPERIDINE HCL 100 MG/ML IJ SOLN
50.0000 mg | Freq: Once | INTRAMUSCULAR | Status: AC
  Administered 2018-01-10: 50 mg via INTRAMUSCULAR

## 2018-01-10 MED ORDER — ONDANSETRON HCL 4 MG/2ML IJ SOLN
4.0000 mg | Freq: Once | INTRAMUSCULAR | Status: AC
  Administered 2018-01-10: 4 mg via INTRAMUSCULAR

## 2018-01-10 MED ORDER — IOPAMIDOL (ISOVUE-M 200) INJECTION 41%
15.0000 mL | Freq: Once | INTRAMUSCULAR | Status: AC
  Administered 2018-01-10: 15 mL via INTRATHECAL

## 2018-01-10 NOTE — Discharge Instructions (Signed)
Myelogram Discharge Instructions  1. Go home and rest quietly for the next 24 hours.  It is important to lie flat for the next 24 hours.  Get up only to go to the restroom.  You may lie in the bed or on a couch on your back, your stomach, your left side or your right side.  You may have one pillow under your head.  You may have pillows between your knees while you are on your side or under your knees while you are on your back.  2. DO NOT drive today.  Recline the seat as far back as it will go, while still wearing your seat belt, on the way home.  3. You may get up to go to the bathroom as needed.  You may sit up for 10 minutes to eat.  You may resume your normal diet and medications unless otherwise indicated.  Drink lots of extra fluids today and tomorrow.  4. The incidence of headache, nausea, or vomiting is about 5% (one in 20 patients).  If you develop a headache, lie flat and drink plenty of fluids until the headache goes away.  Caffeinated beverages may be helpful.  If you develop severe nausea and vomiting or a headache that does not go away with flat bed rest, call [phone].  5. You may resume normal activities after your 24 hours of bed rest is over; however, do not exert yourself strongly or do any heavy lifting tomorrow. If when you get up you have a headache when standing, go back to bed and force fluids for another 24 hours.  6. Call your physician for a follow-up appointment.  The results of your myelogram will be sent directly to your physician by the following day.  7. If you have any questions or if complications develop after you arrive home, please call [phone].  Discharge instructions have been explained to the patient.  The patient, or the person responsible for the patient, fully understands these instructions.  YOU MAY RESTART YOUR TRAZODONE, IMITREX, PHENERGAN AND EXCEDRIN TOMORROW 01/11/2018 AT 09:30AM.

## 2018-01-22 ENCOUNTER — Inpatient Hospital Stay (HOSPITAL_COMMUNITY)
Admission: EM | Admit: 2018-01-22 | Discharge: 2018-01-28 | DRG: 501 | Disposition: A | Discharge status: Home / Self Care | Payer: BLUE CROSS/BLUE SHIELD | Attending: Orthopedic Surgery | Admitting: Orthopedic Surgery

## 2018-01-22 ENCOUNTER — Encounter (HOSPITAL_COMMUNITY)
Admission: EM | Disposition: A | Discharge status: Home / Self Care | Payer: Self-pay | Source: Home / Self Care | Attending: Orthopedic Surgery

## 2018-01-22 ENCOUNTER — Other Ambulatory Visit: Payer: Self-pay

## 2018-01-22 ENCOUNTER — Emergency Department (HOSPITAL_COMMUNITY): Payer: BLUE CROSS/BLUE SHIELD | Admitting: Certified Registered"

## 2018-01-22 ENCOUNTER — Encounter (HOSPITAL_COMMUNITY): Payer: Self-pay

## 2018-01-22 DIAGNOSIS — G40909 Epilepsy, unspecified, not intractable, without status epilepticus: Secondary | ICD-10-CM | POA: Diagnosis present

## 2018-01-22 DIAGNOSIS — Z87891 Personal history of nicotine dependence: Secondary | ICD-10-CM | POA: Diagnosis not present

## 2018-01-22 DIAGNOSIS — M65131 Other infective (teno)synovitis, right wrist: Secondary | ICD-10-CM | POA: Diagnosis not present

## 2018-01-22 DIAGNOSIS — Z6833 Body mass index (BMI) 33.0-33.9, adult: Secondary | ICD-10-CM | POA: Diagnosis not present

## 2018-01-22 DIAGNOSIS — G8929 Other chronic pain: Secondary | ICD-10-CM | POA: Diagnosis present

## 2018-01-22 DIAGNOSIS — W540XXA Bitten by dog, initial encounter: Secondary | ICD-10-CM | POA: Diagnosis not present

## 2018-01-22 DIAGNOSIS — Z91040 Latex allergy status: Secondary | ICD-10-CM | POA: Diagnosis not present

## 2018-01-22 DIAGNOSIS — F419 Anxiety disorder, unspecified: Secondary | ICD-10-CM | POA: Diagnosis present

## 2018-01-22 DIAGNOSIS — L02413 Cutaneous abscess of right upper limb: Secondary | ICD-10-CM | POA: Diagnosis not present

## 2018-01-22 DIAGNOSIS — S41159D Open bite of unspecified upper arm, subsequent encounter: Secondary | ICD-10-CM | POA: Diagnosis not present

## 2018-01-22 DIAGNOSIS — Z888 Allergy status to other drugs, medicaments and biological substances status: Secondary | ICD-10-CM | POA: Diagnosis not present

## 2018-01-22 DIAGNOSIS — S41151A Open bite of right upper arm, initial encounter: Secondary | ICD-10-CM | POA: Diagnosis present

## 2018-01-22 HISTORY — PX: I&D EXTREMITY: SHX5045

## 2018-01-22 LAB — I-STAT CHEM 8, ED
BUN: 10 mg/dL (ref 6–20)
CALCIUM ION: 1.22 mmol/L (ref 1.15–1.40)
CHLORIDE: 107 mmol/L (ref 98–111)
Creatinine, Ser: 0.6 mg/dL (ref 0.44–1.00)
GLUCOSE: 109 mg/dL — AB (ref 70–99)
HCT: 37 % (ref 36.0–46.0)
Hemoglobin: 12.6 g/dL (ref 12.0–15.0)
Potassium: 4.1 mmol/L (ref 3.5–5.1)
SODIUM: 138 mmol/L (ref 135–145)
TCO2: 22 mmol/L (ref 22–32)

## 2018-01-22 LAB — CBC WITH DIFFERENTIAL/PLATELET
Basophils Absolute: 0 10*3/uL (ref 0.0–0.1)
Basophils Relative: 0 %
EOS ABS: 0.1 10*3/uL (ref 0.0–0.7)
EOS PCT: 1 %
HCT: 36.9 % (ref 36.0–46.0)
Hemoglobin: 12.7 g/dL (ref 12.0–15.0)
LYMPHS ABS: 1.4 10*3/uL (ref 0.7–4.0)
Lymphocytes Relative: 15 %
MCH: 34.9 pg — AB (ref 26.0–34.0)
MCHC: 34.4 g/dL (ref 30.0–36.0)
MCV: 101.4 fL — AB (ref 78.0–100.0)
MONO ABS: 0.8 10*3/uL (ref 0.1–1.0)
Monocytes Relative: 9 %
Neutro Abs: 7 10*3/uL (ref 1.7–7.7)
Neutrophils Relative %: 75 %
PLATELETS: 348 10*3/uL (ref 150–400)
RBC: 3.64 MIL/uL — AB (ref 3.87–5.11)
RDW: 13.1 % (ref 11.5–15.5)
WBC: 9.4 10*3/uL (ref 4.0–10.5)

## 2018-01-22 SURGERY — IRRIGATION AND DEBRIDEMENT EXTREMITY
Anesthesia: General | Laterality: Right

## 2018-01-22 MED ORDER — SODIUM CHLORIDE 0.9 % IV BOLUS
1000.0000 mL | Freq: Once | INTRAVENOUS | Status: AC
  Administered 2018-01-22: 1000 mL via INTRAVENOUS

## 2018-01-22 MED ORDER — TRAZODONE HCL 50 MG PO TABS
50.0000 mg | ORAL_TABLET | Freq: Every day | ORAL | Status: DC
  Administered 2018-01-23 – 2018-01-27 (×5): 50 mg via ORAL
  Filled 2018-01-22 (×5): qty 1

## 2018-01-22 MED ORDER — OXYCODONE HCL 5 MG PO TABS
5.0000 mg | ORAL_TABLET | ORAL | Status: DC | PRN
  Administered 2018-01-23 – 2018-01-24 (×10): 10 mg via ORAL
  Administered 2018-01-24: 5 mg via ORAL
  Administered 2018-01-24 – 2018-01-25 (×4): 10 mg via ORAL
  Administered 2018-01-25: 5 mg via ORAL
  Administered 2018-01-26 – 2018-01-28 (×12): 10 mg via ORAL
  Filled 2018-01-22 (×23): qty 2
  Filled 2018-01-22: qty 1
  Filled 2018-01-22 (×7): qty 2

## 2018-01-22 MED ORDER — SUFENTANIL CITRATE 50 MCG/ML IV SOLN
INTRAVENOUS | Status: DC | PRN
  Administered 2018-01-22: 10 ug via INTRAVENOUS
  Administered 2018-01-22: 5 ug via INTRAVENOUS
  Administered 2018-01-22: 10 ug via INTRAVENOUS

## 2018-01-22 MED ORDER — OXYCODONE HCL 5 MG PO TABS: 5.0000 mg | ORAL_TABLET | Freq: Once | ORAL | Status: DC | PRN

## 2018-01-22 MED ORDER — LACTATED RINGERS IV SOLN
INTRAVENOUS | Status: DC | PRN
  Administered 2018-01-22: 22:00:00 via INTRAVENOUS

## 2018-01-22 MED ORDER — LACTATED RINGERS IV SOLN
INTRAVENOUS | Status: DC
  Administered 2018-01-22 – 2018-01-24 (×5): via INTRAVENOUS

## 2018-01-22 MED ORDER — FENTANYL CITRATE (PF) 100 MCG/2ML IJ SOLN
INTRAMUSCULAR | Status: AC
  Administered 2018-01-22: 50 ug via INTRAVENOUS
  Filled 2018-01-22: qty 2

## 2018-01-22 MED ORDER — PROPOFOL 10 MG/ML IV BOLUS
INTRAVENOUS | Status: DC | PRN
  Administered 2018-01-22: 180 mg via INTRAVENOUS

## 2018-01-22 MED ORDER — HYDROMORPHONE HCL 1 MG/ML IJ SOLN
0.5000 mg | INTRAMUSCULAR | Status: DC | PRN
  Administered 2018-01-23 – 2018-01-28 (×31): 1 mg via INTRAVENOUS
  Filled 2018-01-22 (×32): qty 1

## 2018-01-22 MED ORDER — SODIUM CHLORIDE 0.9 % IV SOLN
INTRAVENOUS | Status: DC | PRN
  Administered 2018-01-22: 21:00:00 via INTRAVENOUS

## 2018-01-22 MED ORDER — GLYCOPYRROLATE 0.2 MG/ML IJ SOLN
INTRAMUSCULAR | Status: DC | PRN
  Administered 2018-01-22: 0.2 mg via INTRAVENOUS

## 2018-01-22 MED ORDER — FENTANYL CITRATE (PF) 100 MCG/2ML IJ SOLN
25.0000 ug | INTRAMUSCULAR | Status: DC | PRN
  Administered 2018-01-22 (×3): 50 ug via INTRAVENOUS

## 2018-01-22 MED ORDER — LIDOCAINE 2% (20 MG/ML) 5 ML SYRINGE
INTRAMUSCULAR | Status: DC | PRN
  Administered 2018-01-22: 60 mg via INTRAVENOUS

## 2018-01-22 MED ORDER — VITAMIN C 500 MG PO TABS
1000.0000 mg | ORAL_TABLET | Freq: Every day | ORAL | Status: DC
  Administered 2018-01-24 – 2018-01-28 (×5): 1000 mg via ORAL
  Filled 2018-01-22 (×6): qty 2

## 2018-01-22 MED ORDER — CYCLOBENZAPRINE HCL 10 MG PO TABS
10.0000 mg | ORAL_TABLET | Freq: Every day | ORAL | Status: DC
  Administered 2018-01-23 – 2018-01-27 (×6): 10 mg via ORAL
  Filled 2018-01-22 (×6): qty 1

## 2018-01-22 MED ORDER — PROPRANOLOL HCL 10 MG PO TABS
40.0000 mg | ORAL_TABLET | Freq: Two times a day (BID) | ORAL | Status: DC
  Administered 2018-01-23 – 2018-01-28 (×12): 40 mg via ORAL
  Filled 2018-01-22: qty 1
  Filled 2018-01-22 (×2): qty 4
  Filled 2018-01-22: qty 1
  Filled 2018-01-22: qty 4
  Filled 2018-01-22: qty 1
  Filled 2018-01-22 (×2): qty 4
  Filled 2018-01-22: qty 1
  Filled 2018-01-22 (×2): qty 4
  Filled 2018-01-22: qty 1
  Filled 2018-01-22 (×3): qty 4

## 2018-01-22 MED ORDER — ONDANSETRON HCL 4 MG/2ML IJ SOLN
INTRAMUSCULAR | Status: DC | PRN
  Administered 2018-01-22: 4 mg via INTRAVENOUS

## 2018-01-22 MED ORDER — ALPRAZOLAM 0.5 MG PO TABS
1.0000 mg | ORAL_TABLET | Freq: Three times a day (TID) | ORAL | Status: DC | PRN
Start: 2018-01-22 — End: 2018-01-28
  Administered 2018-01-23 – 2018-01-24 (×3): 1 mg via ORAL
  Administered 2018-01-24: 0.5 mg via ORAL
  Administered 2018-01-25 – 2018-01-28 (×9): 1 mg via ORAL
  Filled 2018-01-22 (×13): qty 2

## 2018-01-22 MED ORDER — LORATADINE 10 MG PO TABS
10.0000 mg | ORAL_TABLET | Freq: Every day | ORAL | Status: DC
  Administered 2018-01-23 – 2018-01-28 (×6): 10 mg via ORAL
  Filled 2018-01-22 (×6): qty 1

## 2018-01-22 MED ORDER — PROMETHAZINE HCL 25 MG PO TABS
25.0000 mg | ORAL_TABLET | Freq: Four times a day (QID) | ORAL | Status: DC | PRN
  Administered 2018-01-23 – 2018-01-25 (×2): 25 mg via ORAL
  Filled 2018-01-22 (×2): qty 1

## 2018-01-22 MED ORDER — SODIUM CHLORIDE 0.9 % IR SOLN
Status: DC | PRN
  Administered 2018-01-22: 3000 mL

## 2018-01-22 MED ORDER — DOCUSATE SODIUM 100 MG PO CAPS
100.0000 mg | ORAL_CAPSULE | Freq: Two times a day (BID) | ORAL | Status: DC
  Administered 2018-01-23 – 2018-01-28 (×12): 100 mg via ORAL
  Filled 2018-01-22 (×12): qty 1

## 2018-01-22 MED ORDER — ROPINIROLE HCL 1 MG PO TABS
1.0000 mg | ORAL_TABLET | Freq: Every day | ORAL | Status: DC
  Administered 2018-01-23 – 2018-01-27 (×6): 1 mg via ORAL
  Filled 2018-01-22 (×6): qty 1

## 2018-01-22 MED ORDER — MIDAZOLAM HCL 5 MG/5ML IJ SOLN
INTRAMUSCULAR | Status: DC | PRN
  Administered 2018-01-22: 2 mg via INTRAVENOUS

## 2018-01-22 MED ORDER — LAMOTRIGINE 100 MG PO TABS
200.0000 mg | ORAL_TABLET | Freq: Two times a day (BID) | ORAL | Status: DC
  Administered 2018-01-23 – 2018-01-28 (×12): 200 mg via ORAL
  Filled 2018-01-22 (×12): qty 2

## 2018-01-22 MED ORDER — HYDROMORPHONE HCL 1 MG/ML IJ SOLN: 0.2500 mg | INTRAMUSCULAR | Status: DC | PRN

## 2018-01-22 MED ORDER — OXYCODONE HCL 5 MG/5ML PO SOLN: 5.0000 mg | Freq: Once | ORAL | Status: DC | PRN

## 2018-01-22 MED ORDER — FAMOTIDINE 20 MG PO TABS: 20.0000 mg | ORAL_TABLET | Freq: Two times a day (BID) | ORAL | Status: DC | PRN

## 2018-01-22 MED ORDER — FENTANYL CITRATE (PF) 100 MCG/2ML IJ SOLN
INTRAMUSCULAR | Status: AC
  Filled 2018-01-22: qty 2

## 2018-01-22 MED ORDER — SODIUM CHLORIDE 0.9 % IV SOLN
3.0000 g | Freq: Four times a day (QID) | INTRAVENOUS | Status: DC
  Administered 2018-01-23 – 2018-01-28 (×23): 3 g via INTRAVENOUS
  Filled 2018-01-22 (×28): qty 3

## 2018-01-22 MED ORDER — SODIUM CHLORIDE 0.9 % IV SOLN
3.0000 g | Freq: Once | INTRAVENOUS | Status: AC
  Administered 2018-01-22: 3 g via INTRAVENOUS
  Filled 2018-01-22: qty 3

## 2018-01-22 MED ORDER — ASPIRIN-ACETAMINOPHEN-CAFFEINE 250-250-65 MG PO TABS
2.0000 | ORAL_TABLET | Freq: Four times a day (QID) | ORAL | Status: DC | PRN
Start: 2018-01-22 — End: 2018-01-28
  Filled 2018-01-22 (×3): qty 2

## 2018-01-22 MED ORDER — GABAPENTIN 400 MG PO CAPS
800.0000 mg | ORAL_CAPSULE | Freq: Four times a day (QID) | ORAL | Status: DC | PRN
  Administered 2018-01-23 (×2): 800 mg via ORAL
  Filled 2018-01-22 (×2): qty 2

## 2018-01-22 SURGICAL SUPPLY — 39 items
BANDAGE ACE 4X5 VEL STRL LF (GAUZE/BANDAGES/DRESSINGS) ×3 IMPLANT
BNDG GAUZE ELAST 4 BULKY (GAUZE/BANDAGES/DRESSINGS) ×3 IMPLANT
CORDS BIPOLAR (ELECTRODE) ×3 IMPLANT
COVER SURGICAL LIGHT HANDLE (MISCELLANEOUS) ×3 IMPLANT
CUFF TOURNIQUET SINGLE 18IN (TOURNIQUET CUFF) ×3 IMPLANT
DRAPE SURG 17X23 STRL (DRAPES) ×3 IMPLANT
DRSG ADAPTIC 3X8 NADH LF (GAUZE/BANDAGES/DRESSINGS) ×3 IMPLANT
DRSG MEPITEL 4X7.2 (GAUZE/BANDAGES/DRESSINGS) ×3 IMPLANT
GAUZE SPONGE 4X4 12PLY STRL (GAUZE/BANDAGES/DRESSINGS) ×3 IMPLANT
GAUZE SPONGE 4X4 16PLY XRAY LF (GAUZE/BANDAGES/DRESSINGS) ×6 IMPLANT
GAUZE XEROFORM 5X9 LF (GAUZE/BANDAGES/DRESSINGS) ×3 IMPLANT
GLOVE BIOGEL PI IND STRL 7.0 (GLOVE) ×1 IMPLANT
GLOVE BIOGEL PI INDICATOR 7.0 (GLOVE) ×2
GLOVE SURG SS PI 7.0 STRL IVOR (GLOVE) ×3 IMPLANT
GOWN STRL REUS W/ TWL LRG LVL3 (GOWN DISPOSABLE) ×1 IMPLANT
GOWN STRL REUS W/ TWL XL LVL3 (GOWN DISPOSABLE) ×2 IMPLANT
GOWN STRL REUS W/TWL LRG LVL3 (GOWN DISPOSABLE) ×2
GOWN STRL REUS W/TWL XL LVL3 (GOWN DISPOSABLE) ×4
KIT BASIN OR (CUSTOM PROCEDURE TRAY) ×3 IMPLANT
KIT TURNOVER KIT B (KITS) ×3 IMPLANT
MANIFOLD NEPTUNE II (INSTRUMENTS) ×3 IMPLANT
NS IRRIG 1000ML POUR BTL (IV SOLUTION) ×3 IMPLANT
PACK ORTHO EXTREMITY (CUSTOM PROCEDURE TRAY) ×3 IMPLANT
PAD ABD 8X10 STRL (GAUZE/BANDAGES/DRESSINGS) ×3 IMPLANT
PAD ARMBOARD 7.5X6 YLW CONV (MISCELLANEOUS) ×3 IMPLANT
PAD CAST 4YDX4 CTTN HI CHSV (CAST SUPPLIES) ×1 IMPLANT
PADDING CAST COTTON 4X4 STRL (CAST SUPPLIES) ×2
SCRUB BETADINE 4OZ XXX (MISCELLANEOUS) ×3 IMPLANT
SET CYSTO W/LG BORE CLAMP LF (SET/KITS/TRAYS/PACK) ×3 IMPLANT
SOL PREP POV-IOD 4OZ 10% (MISCELLANEOUS) ×3 IMPLANT
SPLINT FIBERGLASS 3X12 (CAST SUPPLIES) ×3 IMPLANT
SUT PROLENE 3 0 PS 2 (SUTURE) ×3 IMPLANT
SWAB COLLECTION DEVICE MRSA (MISCELLANEOUS) ×3 IMPLANT
SWAB CULTURE ESWAB REG 1ML (MISCELLANEOUS) ×3 IMPLANT
SYR CONTROL 10ML LL (SYRINGE) IMPLANT
TOWEL OR 17X26 10 PK STRL BLUE (TOWEL DISPOSABLE) ×3 IMPLANT
TUBE CONNECTING 12'X1/4 (SUCTIONS) ×1
TUBE CONNECTING 12X1/4 (SUCTIONS) ×2 IMPLANT
YANKAUER SUCT BULB TIP NO VENT (SUCTIONS) ×3 IMPLANT

## 2018-01-22 NOTE — Op Note (Signed)
See full dictation [id number] SP irrigation and debridement dorsal and volar wounds right forearm secondary to dog bite with concomitant carpal tunnel release and ulnar nerve neuro lysis.  The patient unfortunately had massive infection involving the extensor tendon sheaths and devitalized tissue.  The wound had to be left open.  Will return to the operative theater in 48 hours  IV Unasyn pain management and regular home medicines will be adhered to.  Sueanne Maniaci MD

## 2018-01-22 NOTE — ED Notes (Signed)
RING CUT OFF OF  RIGHT RING FINGER RING GIVEN TO PATIENT

## 2018-01-22 NOTE — Anesthesia Preprocedure Evaluation (Signed)
Anesthesia Evaluation  Patient identified by MRN, date of birth, ID band Patient awake    Reviewed: Allergy & Precautions, NPO status , Patient's Chart, lab work & pertinent test results  History of Anesthesia Complications Negative for: history of anesthetic complications  Airway Mallampati: II  TM Distance: >3 FB Neck ROM: Full    Dental  (+) Partial Lower   Pulmonary former smoker,    breath sounds clear to auscultation       Cardiovascular negative cardio ROS   Rhythm:Regular     Neuro/Psych  Headaches, Seizures -, Well Controlled,  PSYCHIATRIC DISORDERS Anxiety Depression    GI/Hepatic negative GI ROS, Neg liver ROS,   Endo/Other  Morbid obesity  Renal/GU negative Renal ROS     Musculoskeletal   Abdominal   Peds  Hematology negative hematology ROS (+)   Anesthesia Other Findings   Reproductive/Obstetrics                             Anesthesia Physical Anesthesia Plan  ASA: II  Anesthesia Plan: General   Post-op Pain Management:    Induction: Intravenous  PONV Risk Score and Plan: 3 and Dexamethasone and Ondansetron  Airway Management Planned: LMA  Additional Equipment: None  Intra-op Plan:   Post-operative Plan: Extubation in OR  Informed Consent: I have reviewed the patients History and Physical, chart, labs and discussed the procedure including the risks, benefits and alternatives for the proposed anesthesia with the patient or authorized representative who has indicated his/her understanding and acceptance.   Dental advisory given  Plan Discussed with: CRNA and Surgeon  Anesthesia Plan Comments:         Anesthesia Quick Evaluation

## 2018-01-22 NOTE — ED Provider Notes (Signed)
[facility]-EMERGENCY DEPT Provider Note   CSN: [id number] Arrival date & time: 01/22/18  1514     History   Chief Complaint Chief Complaint  Patient presents with  . Animal Bite    HPI Stacy Mejia is a 37 y.o. female.  HPI  The patient is a 37 year old female, she presents to the hospital today with a complaint of right arm swelling and pain.  According to the patient approximately 46 hours ago she was bitten on her right forearm by a pit bull, a dog that she is currently watching for someone that the left the animal with her while they went on vacation and then never came back to pick it up.  The patient reports that this dog bit her on the arm, she was seen at the urgent care and was started on Augmentin, stitches were placed in the open bite wounds of the forearm however over the last 48 hours she has had progressive swelling and pain.  She presents today from the urgent care after a repeat visit told that she may need to come and have further intervention.  She has not had any fevers but, she has had severe and significant swelling of her right forearm with purulent drainage from the wounds as well as severe drainage going down her hand and into her fingers.  She is unable to straighten her fingers without severe pain.  She has a ring on the right fourth finger which is completely stuck.  Past Medical History:  Diagnosis Date  . Allergy   . Anxiety   . Chronic pain   . Depression   . Interstitial cystitis   . Seizures (HCC) 06/2015  . Substance abuse [facility])     Patient Active Problem List   Diagnosis Date Noted  . Tremor 06/28/2017  . Restless leg syndrome 06/18/2016  . 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 Other long term (current) drug therapy 07/10/2014  . Clinical depression 07/10/2014  . PTSD (post-traumatic stress disorder) 06/15/2014  . Conversion disorder with attacks or seizures, persistent, with psychological stressor  04/17/2014  . Chronic interstitial cystitis 08/12/2011  . Chronic migraine without aura 05/25/2011  . Other specified conditions associated with female genital organs and menstrual cycle 05/25/2011  . 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 OTHER SPEC DISEASES BLOOD&BLOOD-FORMING ORGANS 09/11/2009  . GENERALIZED ANXIETY DISORDER 09/11/2009  . SMOKER 09/11/2009  . ERYTHEMA NODOSUM 09/11/2009  . DIZZINESS 09/11/2009  . PERSONAL HX OTH INFECTIOUS&PARASITIC DISEASE 09/11/2009  . FEVER, HX OF 09/11/2009    Past Surgical History:  Procedure Laterality Date  . bladder stimulater    . BLADDER SURGERY    . CHOLECYSTECTOMY    . TONSILLECTOMY       OB History   None      Home Medications    Prior to Admission medications   Medication Sig Start Date End Date Taking? Authorizing Provider  ALPRAZolam Prudy Feeler) 1 MG tablet as needed. 08/22/15   [provider]  aspirin-acetaminophen-caffeine (EXCEDRIN MIGRAINE) [phone] MG tablet Take 2 tablets by mouth every 6 (six) hours as needed for headache.    [provider]  cyclobenzaprine (FLEXERIL) 5 MG tablet Take 10 mg by mouth at bedtime.    [provider]  fexofenadine (ALLEGRA) 60 MG tablet Take 60 mg by mouth 2 (two) times daily.    [provider]  gabapentin (NEURONTIN) 800 MG tablet 4 (four) times daily.  08/22/15   [provider]  lamoTRIgine (LAMICTAL)  200 MG tablet Take 1 tablet (200 mg total) by mouth 2 (two) times daily. 12/09/17   Van Clines, MD  medroxyPROGESTERone (DEPO-PROVERA) 150 MG/ML injection Inject 150 mg into the muscle every 3 (three) months.     [provider]  naloxone [facility]) nasal spray 4 mg/0.1 mL Place 4 mg into the nose as directed. 03/23/16   [provider]  NAPROXEN PO Take by mouth.    [provider]  omeprazole (PRILOSEC) 40 MG capsule Take 40 mg by mouth daily.    [provider]  pentosan polysulfate (ELMIRON)  100 MG capsule Take 100 mg by mouth. 08/28/15   [provider]  phenazopyridine (PYRIDIUM) 100 MG tablet Take 100 mg by mouth as needed.    [provider]  promethazine (PHENERGAN) 25 MG tablet Take 25 mg by mouth as needed. 08/18/16   [provider]  propranolol (INDERAL) 40 MG tablet Take 1 tablet twice a day 12/09/17   Van Clines, MD  rOPINIRole (REQUIP) 1 MG tablet Take 1 tablet 2 hours prior to bedtime 12/09/17   Van Clines, MD  SUMAtriptan (IMITREX) 25 MG tablet Take 1 tablet at onset of migraine. Do not take more than 2-3 a week 12/09/17   Van Clines, MD  traZODone (DESYREL) 50 MG tablet Take by mouth. 10/14/17   [provider]    Family History Family History  Problem Relation Age of Onset  . Hyperlipidemia Mother   . Hypertension Mother   . Hyperlipidemia Father   . Hypertension Father   . Diabetes Father   . Heart disease Father   . Depression Father   . Seizures Brother 34       drug and alocohol use  . Alcohol abuse Brother   . Cancer Maternal Grandmother        colon  . Cancer Paternal Grandmother        colon    Social History Social History   Tobacco Use  . Smoking status: Former Smoker    Packs/day: 1.00    Types: Cigarettes  . Smokeless tobacco: Never Used  Substance Use Topics  . Alcohol use: No    Alcohol/week: 0.0 oz  . Drug use: No    Comment: "pills and heroin" previously     Allergies   Tequin [gatifloxacin]; Adhesive [tape]; and Latex   Review of Systems Review of Systems  All other systems reviewed and are negative.    Physical Exam Updated Vital Signs BP 138/85 (BP Location: Left Arm)   Pulse 100   Temp 97.8 F (36.6 C) (Oral)   Resp 20   Ht 5\' 2"  (1.575 m)   Wt 83 kg (183 lb)   SpO2 99%   BMI 33.47 kg/m   Physical Exam  Constitutional: She appears well-developed and well-nourished.  The patient is clearly having significant pain  HENT:  Head: Normocephalic and atraumatic.    Mouth/Throat: Oropharynx is clear and moist. No oropharyngeal exudate.  Eyes: Pupils are equal, round, and reactive to light. Conjunctivae and EOM are normal. Right eye exhibits no discharge. Left eye exhibits no discharge. No scleral icterus.  Neck: Normal range of motion. Neck supple. No JVD present. No thyromegaly present.  Cardiovascular: Regular rhythm, normal heart sounds and intact distal pulses. Exam reveals no gallop and no friction rub.  No murmur heard. Tachycardic at 105 bpm  Pulmonary/Chest: Effort normal and breath sounds normal. No respiratory distress. She has no wheezes. She has  no rales.  Abdominal: Soft. Bowel sounds are normal. She exhibits no distension and no mass. There is no tenderness.  Musculoskeletal: Normal range of motion. She exhibits edema and tenderness.  There are sutures in the right forearm with purulent material coming out around the sutures, she has significant swelling and tenderness starting in the mid forearm all the way through the hand which is severely edematous and swollen.  She is unable to straighten her fingers without severe pain.  Lymphadenopathy:    She has no cervical adenopathy.  Neurological: She is alert. Coordination normal.  Neurologically the patient is intact able to walk talk and move all 4 extremities limited only by pain of the right forearm and hand.  Skin: Skin is warm and dry. No rash noted. There is erythema.  Significant erythema and purulent material to the right forearm  Psychiatric: She has a normal mood and affect. Her behavior is normal.  Nursing note and vitals reviewed.    ED Treatments / Results  Labs (all labs ordered are listed, but only abnormal results are displayed) Labs Reviewed  CBC WITH DIFFERENTIAL/PLATELET  I-STAT CHEM 8, ED    EKG None  Radiology No results found.  Procedures Procedures (including critical care time)  Medications Ordered in ED Medications  Ampicillin-Sulbactam (UNASYN) 3 g  in sodium chloride 0.9 % 100 mL IVPB (has no administration in time range)  sodium chloride 0.9 % bolus 1,000 mL (has no administration in time range)     Initial Impression / Assessment and Plan / ED Course  I have reviewed the triage vital signs and the nursing notes.  Pertinent labs & imaging results that were available during my care of the patient were reviewed by me and considered in my medical decision making (see chart for details).  Clinical Course as of Jan 23 1851  Sat Jan 22, 2018  [id number] Discussed care with Dr. Amanda PeaGramig, he has accepted transfer of the patient to [facility] emergency department and will take the patient to the operating room.   [BM]    Clinical Course User Index [BM] Eber HongMiller, Mia Milan, MD    Labs ordered, patient care discussed with the surgeon, she will need to go to the operating room.  She is very ill with this infection, antibiotic started.  Final Clinical Impressions(s) / ED Diagnoses   Final diagnoses:  Abscess of forearm, right      Eber HongMiller, Nitika Jackowski, MD 01/22/18 [phone]

## 2018-01-22 NOTE — H&P (Signed)
Stacy Mejia is an 37 y.o. female.   Chief Complaint: Status post dog bite Thursday which is 3 days ago.  She has an infected part rule out forearm secondary to the dog bite sequelae HPI: Patient was bit by dog Thursday.  She went to an urgent care on his [facility] urgent care.  Her wounds were sutured.  Subsequent to this the patient was put on antibiotics Friday after being seen Thursday in the urgent care.  Today she noted severe worsening in the arm and it was felt that she should go to the hospital.  She is complaining of inability to move her fingers severe swelling pain and loss of function.  She denies other injury.  She is alert and oriented.  I reviewed this with her at length and the findings.  She states she has history of interstitial cystitis as well as anxiety and a history of epilepsy.  She states these are her main medical issues.  Past Medical History:  Diagnosis Date  . Allergy   . Anxiety   . Chronic pain   . Depression   . Interstitial cystitis   . Seizures (HCC) 06/2015  . Substance abuse [facility])     Past Surgical History:  Procedure Laterality Date  . bladder stimulater    . BLADDER SURGERY    . CHOLECYSTECTOMY    . TONSILLECTOMY      Family History  Problem Relation Age of Onset  . Hyperlipidemia Mother   . Hypertension Mother   . Hyperlipidemia Father   . Hypertension Father   . Diabetes Father   . Heart disease Father   . Depression Father   . Seizures Brother 34       drug and alocohol use  . Alcohol abuse Brother   . Cancer Maternal Grandmother        colon  . Cancer Paternal Grandmother        colon   Social History:  reports that she has quit smoking. Her smoking use included cigarettes. She smoked 1.00 pack per day. She has never used smokeless tobacco. She reports that she does not drink alcohol or use drugs.  Allergies:  Allergies  Allergen Reactions  . Tequin [Gatifloxacin] Anaphylaxis    Throat closes up  . Adhesive [Tape] Itching  and Rash  . Latex Rash     (Not in a hospital admission)  Results for orders placed or performed during the hospital encounter of 01/22/18 (from the past 48 hour(s))  CBC with Differential/Platelet     Status: Abnormal   Collection Time: 01/22/18  7:12 PM  Result Value Ref Range   WBC 9.4 4.0 - 10.5 K/uL   RBC 3.64 (L) 3.87 - 5.11 MIL/uL   Hemoglobin 12.7 12.0 - 15.0 g/dL   HCT 56.3 87.5 - 64.3 %   MCV 101.4 (H) 78.0 - 100.0 fL   MCH 34.9 (H) 26.0 - 34.0 pg   MCHC 34.4 30.0 - 36.0 g/dL   RDW 32.9 51.8 - 84.1 %   Platelets 348 150 - 400 K/uL   Neutrophils Relative % 75 %   Neutro Abs 7.0 1.7 - 7.7 K/uL   Lymphocytes Relative 15 %   Lymphs Abs 1.4 0.7 - 4.0 K/uL   Monocytes Relative 9 %   Monocytes Absolute 0.8 0.1 - 1.0 K/uL   Eosinophils Relative 1 %   Eosinophils Absolute 0.1 0.0 - 0.7 K/uL   Basophils Relative 0 %   Basophils Absolute 0.0 0.0 -  0.1 K/uL    Comment: Performed at [facility], [address] [place], [place] [address]  I-stat chem 8, ed     Status: Abnormal   Collection Time: 01/22/18  7:14 PM  Result Value Ref Range   Sodium 138 135 - 145 mmol/L   Potassium 4.1 3.5 - 5.1 mmol/L   Chloride 107 98 - 111 mmol/L   BUN 10 6 - 20 mg/dL   Creatinine, Ser 6.040.60 0.44 - 1.00 mg/dL   Glucose, Bld 540109 (H) 70 - 99 mg/dL   Calcium, Ion 9.811.22 1.911.15 - 1.40 mmol/L   TCO2 22 22 - 32 mmol/L   Hemoglobin 12.6 12.0 - 15.0 g/dL   HCT 47.837.0 29.536.0 - 62.146.0 %   No results found.  Review of Systems  HENT: Negative.   Respiratory: Negative.   Cardiovascular: Negative.   Gastrointestinal: Negative.     Blood pressure 138/90, pulse 94, temperature 98.9 F (37.2 C), temperature source Oral, resp. rate 18, height 5\' 2"  (1.575 m), weight 83 kg (183 lb), SpO2 99 %. Physical Exam  Patient has multiple dog bites which are sutured about the dorsal and volar forearm.  She has loss of motion in the fingers decreased sensation slightly about her hand.  There is no  signs of acute compartment syndrome but certainly she has a abscess that has been sutured shut.  I reviewed this with her at length and discussed her our thoughts on treatment of animal bites in general.  The patient is alert and oriented in no acute distress. The patient complains of pain in the affected upper extremity.  The patient is noted to have a normal HEENT exam. Lung fields show equal chest expansion and no shortness of breath. Abdomen exam is nontender without distention. Lower extremity examination does not show any fracture dislocation or blood clot symptoms. Pelvis is stable and the neck and back are stable and nontender. Assessment/Plan Infected right forearm dog bite with multiple puncture wounds dorsal and volar sutured 3 days ago.  The patient has an obvious purulent infection.  I reviewed this with her at length and the findings.  I would recommend irrigation debridement.  I discussed our technique of irrigation debridement leaving the wounds open to allow for drainage.  She understands this.  [facility].  We will continue Unasyn postoperatively.  I discussed with her all issues.  We are planning surgery for your upper extremity. The risk and benefits of surgery to include risk of bleeding, infection, anesthesia,  damage to normal structures and failure of the surgery to accomplish its intended goals of relieving symptoms and restoring function have been discussed in detail. With this in mind we plan to proceed. I have specifically discussed with the patient the pre-and postoperative regime and the dos and don'ts and risk and benefits in great detail. Risk and benefits of surgery also include risk of dystrophy(CRPS), chronic nerve pain, failure of the healing process to go onto completion and other inherent risks of surgery The relavent the pathophysiology of the disease/injury process, as well as the alternatives for treatment and postoperative  course of action has been discussed in great detail with the patient who desires to proceed.  We will do everything in our power to help you (the patient) restore function to the upper extremity. It is a pleasure to see this patient today.  Oletta CohnWilliam M Chelcy Bolda III, MD 01/22/2018, 8:49 PM

## 2018-01-22 NOTE — ED Triage Notes (Signed)
Pt states bitten by dog on 7/18. [facility] for [facility]x. Pt states taht arm has become more red and swollen, and painful. Dog was up to date on all shots at time of injury.

## 2018-01-22 NOTE — ED Notes (Signed)
[facility] here for transport of pt to [facility] 

## 2018-01-22 NOTE — Transfer of Care (Signed)
Immediate Anesthesia Transfer of Care Note  Patient: Stacy Mejia  Procedure(s) Performed: IRRIGATION AND DEBRIDEMENT OF FOREARM (Right )  Patient Location: PACU  Anesthesia Type:General  Level of Consciousness: awake, drowsy and patient cooperative  Airway & Oxygen Therapy: Patient Spontanous Breathing and Patient connected to nasal cannula oxygen  Post-op Assessment: Report given to RN, Post -op Vital signs reviewed and stable and Patient moving all extremities X 4  Post vital signs: Reviewed and stable  Last Vitals:  Vitals Value Taken Time  BP 143/109 01/22/2018 10:27 PM  Temp    Pulse 86 01/22/2018 10:27 PM  Resp 14 01/22/2018 10:27 PM  SpO2 99 % 01/22/2018 10:27 PM  Vitals shown include unvalidated device data.  Last Pain:  Vitals:   01/22/18 1933  TempSrc: Oral  PainSc: 9          Complications: No apparent anesthesia complications

## 2018-01-22 NOTE — Anesthesia Procedure Notes (Signed)
Procedure Name: LMA Insertion Date/Time: 01/22/2018 9:07 PM Performed by: Melina SchoolsBanks, Glema Takaki J, CRNA Pre-anesthesia Checklist: Patient identified, Emergency Drugs available, Suction available, Patient being monitored and Timeout performed Patient Re-evaluated:Patient Re-evaluated prior to induction Oxygen Delivery Method: Circle system utilized Preoxygenation: Pre-oxygenation with 100% oxygen Induction Type: IV induction Ventilation: Mask ventilation without difficulty LMA: LMA inserted LMA Size: 4.0 Number of attempts: 1 Placement Confirmation: positive ETCO2 and breath sounds checked- equal and bilateral Tube secured with: Tape Dental Injury: Teeth and Oropharynx as per pre-operative assessment

## 2018-01-23 DIAGNOSIS — M65131 Other infective (teno)synovitis, right wrist: Secondary | ICD-10-CM | POA: Diagnosis present

## 2018-01-23 DIAGNOSIS — W540XXA Bitten by dog, initial encounter: Secondary | ICD-10-CM | POA: Diagnosis not present

## 2018-01-23 DIAGNOSIS — Z6833 Body mass index (BMI) 33.0-33.9, adult: Secondary | ICD-10-CM | POA: Diagnosis not present

## 2018-01-23 DIAGNOSIS — F419 Anxiety disorder, unspecified: Secondary | ICD-10-CM | POA: Diagnosis present

## 2018-01-23 DIAGNOSIS — G40909 Epilepsy, unspecified, not intractable, without status epilepticus: Secondary | ICD-10-CM | POA: Diagnosis present

## 2018-01-23 DIAGNOSIS — L02413 Cutaneous abscess of right upper limb: Secondary | ICD-10-CM | POA: Diagnosis present

## 2018-01-23 DIAGNOSIS — Z91040 Latex allergy status: Secondary | ICD-10-CM | POA: Diagnosis not present

## 2018-01-23 DIAGNOSIS — G8929 Other chronic pain: Secondary | ICD-10-CM | POA: Diagnosis present

## 2018-01-23 DIAGNOSIS — Z87891 Personal history of nicotine dependence: Secondary | ICD-10-CM | POA: Diagnosis not present

## 2018-01-23 DIAGNOSIS — Z888 Allergy status to other drugs, medicaments and biological substances status: Secondary | ICD-10-CM | POA: Diagnosis not present

## 2018-01-23 DIAGNOSIS — S41159D Open bite of unspecified upper arm, subsequent encounter: Secondary | ICD-10-CM | POA: Diagnosis not present

## 2018-01-23 LAB — CBC
HEMATOCRIT: 35.3 % — AB (ref 36.0–46.0)
HEMOGLOBIN: 11.7 g/dL — AB (ref 12.0–15.0)
MCH: 34.6 pg — ABNORMAL HIGH (ref 26.0–34.0)
MCHC: 33.1 g/dL (ref 30.0–36.0)
MCV: 104.4 fL — ABNORMAL HIGH (ref 78.0–100.0)
PLATELETS: 288 10*3/uL (ref 150–400)
RBC: 3.38 MIL/uL — AB (ref 3.87–5.11)
RDW: 13.2 % (ref 11.5–15.5)
WBC: 10 10*3/uL (ref 4.0–10.5)

## 2018-01-23 LAB — BASIC METABOLIC PANEL
ANION GAP: 8 (ref 5–15)
BUN: 7 mg/dL (ref 6–20)
CALCIUM: 8.8 mg/dL — AB (ref 8.9–10.3)
CO2: 22 mmol/L (ref 22–32)
Chloride: 108 mmol/L (ref 98–111)
Creatinine, Ser: 0.62 mg/dL (ref 0.44–1.00)
GFR calc Af Amer: >60 mL/min (ref >60)
GLUCOSE: 108 mg/dL — AB (ref 70–99)
Potassium: 3.8 mmol/L (ref 3.5–5.1)
Sodium: 138 mmol/L (ref 135–145)

## 2018-01-23 LAB — C-REACTIVE PROTEIN: CRP: 10.6 mg/dL — AB (ref <1.0)

## 2018-01-23 LAB — SEDIMENTATION RATE: Sed Rate: 54 mm/hr — ABNORMAL HIGH (ref 0–22)

## 2018-01-23 NOTE — Progress Notes (Signed)
Received pt from PACU. Pt is alert and oriented. Complains of 7/10 surgical pain to right arm. Vital signs taken. Right foream immobilized, with ice pack over the arm. Oriented pt with room and use of call light. Will monitor pt.

## 2018-01-23 NOTE — Progress Notes (Addendum)
  Patient ID: Stacy Mejia, female   DOB: 01/31/1981, 37 y.o.   MRN: [id number]   Status post dog bite with infectious tenosynovitis both about the flexor and extensor apparatus requiring debridement fasciotomy tenolysis tenosynovectomy and carpal tunnel release with ulnar nerve neuro lysis.   I discussed with patient all issues today including her diagnosis and the intraoperative findings.  We had to leave her wounds open given the severity of the infection.  We will await her culture and wants the culture is obtained narrow her scope with antibiotics.  For now would recommend Unasyn.  I will return her to the operative theater tomorrow for repeat washout given the severity of her wounds.  I discussed with her this may take numerous events in terms of the irrigation and debridements etc.  At present time she has no complicating features.  I informed her that this will be a very long and detailed bite to recovery to try and get her back to normal.  Unfortunately she will be quite sore stiff and this will impact her over the weeks to come in terms of job and activities of daily living.  At present time I would recommend elevation move and massage fingers.  I have discussed with her the antibiotic treatment and protocol.  I discussed with her we will await cultures.  She has had no complications to date.  We will keep her on her medicines for interstitial cystitis and her epilepsy issues.  If she has any issues worsening problems she will let me know immediately.  I will get her booked for the surgery event tomorrow. We have discussed with the patient the issues regarding their infection to the extremity. We will continue antibiotics and await culture results. Often times it will take 3-5 days for cultures to become final. During this time we will typically have the patient on intravenous antibiotics until we can find a parenteral route of antibiotic regime specific for the bacteria or  organism isolated. We have discussed with the patient the need for daily irrigation and debridement as well as therapy to the area. We have discussed with the patient the necessity of range of motion to the involved joints as discussed today. We have discussed with the patient the unpredictability of infections at times. We'll continue to work towards good pain control and restoration of function. The patient understands the need for meticulous wound care and the necessity of proper followup.  The possible complications of stiffness (loss of motion), resistant infection, possible deep bone infection, possible chronic pain issues, possible need for multiple surgeries and even amputation.  With this in mind the patient understands our goal is to eradicate the infection to quiesence. We will continue to work towards these goals. The patient is alert and oriented in no acute distress. The patient complains of pain in the affected upper extremity.  The patient is noted to have a normal HEENT exam. Lung fields show equal chest expansion and no shortness of breath. Abdomen exam is nontender without distention. Lower extremity examination does not show any fracture dislocation or blood clot symptoms. Pelvis is stable and the neck and back are stable and nontender.   All questions have been addressed.  Quinnie Barcelo MD

## 2018-01-23 NOTE — Op Note (Signed)
NAMEMISAO, FACKRELL MEDICAL RECORD ZO:[id number] ACCOUNT [id number] DATE OF BIRTH:11-21-1980 FACILITY: [facility] LOCATION: [facility] PHYSICIAN:Markeeta Scalf M. Elif Yonts, MD  OPERATIVE REPORT  DATE OF PROCEDURE:  01/22/2018  PREOPERATIVE DIAGNOSIS:  Status post dog bite 3 days ago.  Treated with I and D and suturing with subsequent development of significant infection about the bite wounds dorsally x2 and volarly x2.  Unfortunately, the patient has developed a severe flexor  and extensor purulent tenosynovitis.  POSTOPERATIVE DIAGNOSIS:  Status post dog bite 3 days ago.  Treated with I and D and suturing with subsequent development of significant infection about the bite wounds dorsally x2 and volarly x2.  Unfortunately, the patient has developed a severe flexor  and extensor purulent tenosynovitis with abscess in the interosseous membrane as well as the carpal tunnel and Guyon's canal region and the distal radial ulnar joint region.  SURGICAL PROCEDURE: 1.  Irrigation and debridement deep abscess dorsal ulnar forearm, extensive in nature.  This was an I and D with curette, knife and scissor, excisional in nature. 2.  Irrigation and debridement volar abscess skin and subcutaneous tissue, muscle and associated soft tissues.  This was an excisional debridement with curette, knife and scissor. 3.  Fasciotomy extensor forearm. 4.  Extensor carpi ulnaris tenolysis tenosynovectomy. 5.  Extensor digiti minimi tenolysis tenosynovectomy. 6.  Extensor digitorum communis tenolysis tenosynovectomy about the right dorsal ulnar forearm. 7.  Arthrotomy, synovectomy distal radial ulnar joint from a dorsal approach secondary to infection. 8.  Flexor carpi ulnaris tenolysis tenosynovectomy extensive in nature with abscess formation and purulent tenosynovitis. 9.  Flexor tenosynovectomy, radical in nature secondary to infection. 10.  Carpal canal release/carpal tunnel release, right upper extremity. 11.  Ulnar nerve  neurolysis, right upper extremity. 12.  Ulnar artery exploration and decompression.  SURGEON:  Dominica Severin, MD  ASSISTANT:  None.  COMPLICATIONS:  None.  ANESTHESIA:  General.  TOURNIQUET TIME:  Less than an hour.  INDICATIONS FOR PROCEDURE:  This patient is an unfortunate 37 year old female with history of anxiety, interstitial cystitis and other medical issues as discussed.  She presents on a Saturday evening for evaluation and definitive care for her forearm.  The patient was bit by a dog Thursday.  She states she had the wound closed in an urgent care [facility]).  Subsequent to this on Friday, she began Augmentin.  Today, she returned to the urgent care and was told to go to an emergency room at  a hospital as she would need more aggressive measures.  I have examined her at length.  She cannot move her fingers.  She gets horrible pain and massive swelling.  She has had to cut her rings off.  The patient has complete loss of function due to massive infection.  She has 2 wounds dorsally which have  purulent material emanating from the areas that were previously closed with nylon suture.  She has emanating purulence from 2 volar wounds where 2 nylon sutures are located as well after closure on Thursday.  She has obvious infection.  I have discussed with her the risks and benefits of surgery, the timeframe, duration of recovery, and other  issues.  With this in mind, we will proceed accordingly.  DESCRIPTION OF PROCEDURE:  The patient was seen by myself and anesthesia and taken to the operative theater, underwent general anesthetic in the form of an LMA general anesthetic.  She was prepped and draped with Hibiclens scrub by myself followed by a  10-minute surgical Betadine scrub.  Following this, sterile field was secured.  Timeout observed.  Arm was elevated, tourniquet was insufflated.  The patient had a large amount of purulent material emanating dorsally.  I went ahead  and cultured this.   Culture for aerobic and anaerobic cultures as well as Gram stain was provided.  Once this was completed, I made a cut between the 2 lacerations which were sutured and removed the sutures.  I then made proximal and distal extensions.  At this juncture, we then performed very careful and cautious elevation of the skin.  I decompressed  the abscess.  She has a large amount of fatty necrosis and a large amount of devitalized skin tissue.  A curette, knife and scissors were used to decompress the abscess.  Following this, I then identified the ECU tendon, which was encroached upon and  had purulent material in its sheath.  The sheath was incised, and a tenolysis tenosynovectomy occurred.  Following this, the EDM was also released and underwent a tenolysis tenosynovectomy.  Following this, the [facility] underwent a tenolysis tenosynovectomy and fascial release.  I performed a fasciotomy about the forearm.  At this time, I noticed pus emanating  from the interosseous membrane and thus exposed this as well as the distal radial ulnar joint as purulent material was noted here as well.  The area was exposed nicely.  Following exposure of the area,  the patient then underwent major debridement of the  interosseous membrane region, and portions of nonviable pre-necrotic tissue were removed.  In addition to this, a fasciectomy was accomplished in areas.  The patient had the distal radial ulnar joint exposed and opened.  I tried very carefully not to destabilize the distal radial ulnar joint.  I irrigated copiously at this time.  Following this, I flipped the hand and performed a removal of prior sutures from two 1 cm bite wounds.  I then connected the lacerations and made proximal and distal extensions.  This was also very impressive.  The patient had purulent material in Guyon's canal and the FCU.  The FCU underwent a release and a tenolysis tenosynovectomy.  Following this, the ulnar nerve and  ulnar artery were explored.  Purulent material around these  structures was carefully removed under 4.0 loupe magnification.  This was an ulnar nerve neurolysis and an exploration of the ulnar artery with decompression from necrotic tissue.  Following this, the flexor canal was identified, and the median nerve was identified.  Given the tightness of the hand with swelling, we performed a carpal tunnel release.  I did this without difficulty under direct visualization in hopes that she will  not have median nerve symptomatology due to massive swelling.  At this juncture, we then performed the final tenolysis tenosynovectomy measures for the flexor tendons and at this juncture set out to irrigate.  The patient had irrigation of 6-7 L placed  through cysto tubing about the dorsal and volar areas.  Following this, I tacked loosely 2 areas volarly to make sure that the nerve and vessel constructs were covered and left the wounds open to drain.  Following this, I then left the dorsal ulnar wound  wide open and performed a standard dressing followed by reinforcement dressing and fiberglass splint.  The patient was awoken from anesthesia and taken to recovery room.  All sponge, needle and instrument counts reported as correct.  There were no  complicating features.  She will be monitored in the recovery room and then be admitted for IV antibiotics  in the form of Unasyn, pain management, and postop algorithm of care.  She will have to return to the operative theater Monday for repeat washout.   We will go day by day depending on how the conditions look.  This is unfortunately a massive infection involving the tendon sheath as well as the deep structures.  I did perform an x-ray at the conclusion of the case, and I did not see any bony lesion.   Do's and don'ts have been discussed and all questions have been encouraged and answered.  LN/NUANCE  D:01/22/2018 T:01/23/2018 JOB:[id number]

## 2018-01-24 ENCOUNTER — Encounter (HOSPITAL_COMMUNITY)
Admission: EM | Disposition: A | Discharge status: Home / Self Care | Payer: Self-pay | Source: Home / Self Care | Attending: Orthopedic Surgery

## 2018-01-24 ENCOUNTER — Inpatient Hospital Stay (HOSPITAL_COMMUNITY): Payer: BLUE CROSS/BLUE SHIELD | Admitting: Anesthesiology

## 2018-01-24 ENCOUNTER — Encounter (HOSPITAL_COMMUNITY): Payer: Self-pay | Admitting: Orthopedic Surgery

## 2018-01-24 HISTORY — PX: I&D EXTREMITY: SHX5045

## 2018-01-24 LAB — MRSA PCR SCREENING: MRSA by PCR: NEGATIVE

## 2018-01-24 SURGERY — IRRIGATION AND DEBRIDEMENT EXTREMITY
Anesthesia: General | Site: Arm Lower | Laterality: Right

## 2018-01-24 MED ORDER — HYDROMORPHONE HCL 1 MG/ML IJ SOLN
0.2500 mg | INTRAMUSCULAR | Status: DC | PRN
  Administered 2018-01-24: 0.5 mg via INTRAVENOUS

## 2018-01-24 MED ORDER — HYDROMORPHONE HCL 1 MG/ML IJ SOLN
INTRAMUSCULAR | Status: AC
  Administered 2018-01-26: 1 mg via INTRAVENOUS
  Filled 2018-01-24: qty 1

## 2018-01-24 MED ORDER — PROPOFOL 10 MG/ML IV BOLUS
INTRAVENOUS | Status: DC | PRN
  Administered 2018-01-24: 150 mg via INTRAVENOUS

## 2018-01-24 MED ORDER — DEXAMETHASONE SODIUM PHOSPHATE 4 MG/ML IJ SOLN
INTRAMUSCULAR | Status: DC | PRN
  Administered 2018-01-24: 8 mg via INTRAVENOUS

## 2018-01-24 MED ORDER — ONDANSETRON HCL 4 MG/2ML IJ SOLN
INTRAMUSCULAR | Status: AC
  Filled 2018-01-24: qty 2

## 2018-01-24 MED ORDER — MIDAZOLAM HCL 2 MG/2ML IJ SOLN
INTRAMUSCULAR | Status: AC
  Filled 2018-01-24: qty 2

## 2018-01-24 MED ORDER — LIDOCAINE 2% (20 MG/ML) 5 ML SYRINGE
INTRAMUSCULAR | Status: DC | PRN
  Administered 2018-01-24: 100 mg via INTRAVENOUS

## 2018-01-24 MED ORDER — SODIUM CHLORIDE 0.9 % IR SOLN
Status: DC | PRN
  Administered 2018-01-24: 1000 mL

## 2018-01-24 MED ORDER — LIDOCAINE 2% (20 MG/ML) 5 ML SYRINGE
INTRAMUSCULAR | Status: AC
  Filled 2018-01-24: qty 5

## 2018-01-24 MED ORDER — PROMETHAZINE HCL 25 MG/ML IJ SOLN: 6.2500 mg | INTRAMUSCULAR | Status: DC | PRN

## 2018-01-24 MED ORDER — LACTATED RINGERS IV SOLN
INTRAVENOUS | Status: DC | PRN
  Administered 2018-01-24: 17:00:00 via INTRAVENOUS

## 2018-01-24 MED ORDER — PROPOFOL 10 MG/ML IV BOLUS
INTRAVENOUS | Status: AC
  Filled 2018-01-24: qty 20

## 2018-01-24 MED ORDER — OXYCODONE HCL 5 MG PO TABS
ORAL_TABLET | ORAL | Status: AC
  Filled 2018-01-24: qty 2

## 2018-01-24 MED ORDER — GLYCOPYRROLATE PF 0.2 MG/ML IJ SOSY
PREFILLED_SYRINGE | INTRAMUSCULAR | Status: DC | PRN
  Administered 2018-01-24: .1 mg via INTRAVENOUS

## 2018-01-24 MED ORDER — FENTANYL CITRATE (PF) 100 MCG/2ML IJ SOLN
INTRAMUSCULAR | Status: DC | PRN
  Administered 2018-01-24: 50 ug via INTRAVENOUS

## 2018-01-24 MED ORDER — CYCLOBENZAPRINE HCL 10 MG PO TABS
ORAL_TABLET | ORAL | Status: AC
  Filled 2018-01-24: qty 1

## 2018-01-24 MED ORDER — DEXAMETHASONE SODIUM PHOSPHATE 10 MG/ML IJ SOLN
INTRAMUSCULAR | Status: AC
  Filled 2018-01-24: qty 1

## 2018-01-24 MED ORDER — FENTANYL CITRATE (PF) 250 MCG/5ML IJ SOLN
INTRAMUSCULAR | Status: AC
  Filled 2018-01-24: qty 5

## 2018-01-24 MED ORDER — ONDANSETRON HCL 4 MG/2ML IJ SOLN
INTRAMUSCULAR | Status: DC | PRN
  Administered 2018-01-24: 4 mg via INTRAVENOUS

## 2018-01-24 MED ORDER — MIDAZOLAM HCL 5 MG/5ML IJ SOLN
INTRAMUSCULAR | Status: DC | PRN
  Administered 2018-01-24: 2 mg via INTRAVENOUS

## 2018-01-24 MED ORDER — ALPRAZOLAM 0.25 MG PO TABS
ORAL_TABLET | ORAL | Status: AC
  Administered 2018-01-26: 1 mg via ORAL
  Filled 2018-01-24: qty 2

## 2018-01-24 MED ORDER — BUPIVACAINE HCL (PF) 0.25 % IJ SOLN
INTRAMUSCULAR | Status: AC
  Filled 2018-01-24: qty 30

## 2018-01-24 SURGICAL SUPPLY — 46 items
BANDAGE ACE 4X5 VEL STRL LF (GAUZE/BANDAGES/DRESSINGS) ×3 IMPLANT
BANDAGE ACE 6X5 VEL STRL LF (GAUZE/BANDAGES/DRESSINGS) ×3 IMPLANT
BNDG CONFORM 2 STRL LF (GAUZE/BANDAGES/DRESSINGS) IMPLANT
BNDG GAUZE ELAST 4 BULKY (GAUZE/BANDAGES/DRESSINGS) ×6 IMPLANT
CORDS BIPOLAR (ELECTRODE) ×3 IMPLANT
COVER SURGICAL LIGHT HANDLE (MISCELLANEOUS) ×3 IMPLANT
CUFF TOURNIQUET SINGLE 18IN (TOURNIQUET CUFF) ×3 IMPLANT
CUFF TOURNIQUET SINGLE 24IN (TOURNIQUET CUFF) IMPLANT
DRAIN TLS ROUND 10FR (DRAIN) ×6 IMPLANT
DRSG ADAPTIC 3X8 NADH LF (GAUZE/BANDAGES/DRESSINGS) IMPLANT
DRSG MEPITEL 4X7.2 (GAUZE/BANDAGES/DRESSINGS) ×6 IMPLANT
GAUZE SPONGE 4X4 12PLY STRL (GAUZE/BANDAGES/DRESSINGS) ×3 IMPLANT
GAUZE XEROFORM 1X8 LF (GAUZE/BANDAGES/DRESSINGS) IMPLANT
GAUZE XEROFORM 5X9 LF (GAUZE/BANDAGES/DRESSINGS) ×6 IMPLANT
GLOVE BIOGEL M 8.0 STRL (GLOVE) IMPLANT
GLOVE SS BIOGEL STRL SZ 8 (GLOVE) IMPLANT
GLOVE SUPERSENSE BIOGEL SZ 8 (GLOVE)
GOWN STRL REUS W/ TWL LRG LVL3 (GOWN DISPOSABLE) ×1 IMPLANT
GOWN STRL REUS W/ TWL XL LVL3 (GOWN DISPOSABLE) ×1 IMPLANT
GOWN STRL REUS W/TWL LRG LVL3 (GOWN DISPOSABLE) ×2
GOWN STRL REUS W/TWL XL LVL3 (GOWN DISPOSABLE) ×2
KIT BASIN OR (CUSTOM PROCEDURE TRAY) ×3 IMPLANT
KIT TURNOVER KIT B (KITS) ×3 IMPLANT
MANIFOLD NEPTUNE II (INSTRUMENTS) ×3 IMPLANT
NEEDLE HYPO 25GX1X1/2 BEV (NEEDLE) IMPLANT
NS IRRIG 1000ML POUR BTL (IV SOLUTION) ×3 IMPLANT
PACK ORTHO EXTREMITY (CUSTOM PROCEDURE TRAY) ×3 IMPLANT
PAD ARMBOARD 7.5X6 YLW CONV (MISCELLANEOUS) ×3 IMPLANT
PAD CAST 4YDX4 CTTN HI CHSV (CAST SUPPLIES) ×1 IMPLANT
PADDING CAST COTTON 4X4 STRL (CAST SUPPLIES) ×2
PADDING CAST COTTON 6X4 STRL (CAST SUPPLIES) ×3 IMPLANT
SCRUB BETADINE 4OZ XXX (MISCELLANEOUS) ×3 IMPLANT
SET CYSTO W/LG BORE CLAMP LF (SET/KITS/TRAYS/PACK) ×3 IMPLANT
SOL PREP POV-IOD 4OZ 10% (MISCELLANEOUS) ×3 IMPLANT
SPLINT PLASTER CAST XFAST 3X15 (CAST SUPPLIES) ×1 IMPLANT
SPLINT PLASTER XTRA FASTSET 3X (CAST SUPPLIES) ×2
SPONGE LAP 4X18 RFD (DISPOSABLE) ×3 IMPLANT
SUT PROLENE 3 0 PS 2 (SUTURE) ×15 IMPLANT
SWAB CULTURE ESWAB REG 1ML (MISCELLANEOUS) IMPLANT
SYR CONTROL 10ML LL (SYRINGE) ×3 IMPLANT
TOWEL OR 17X24 6PK STRL BLUE (TOWEL DISPOSABLE) ×3 IMPLANT
TOWEL OR 17X26 10 PK STRL BLUE (TOWEL DISPOSABLE) ×3 IMPLANT
TUBE CONNECTING 12'X1/4 (SUCTIONS) ×1
TUBE CONNECTING 12X1/4 (SUCTIONS) ×2 IMPLANT
WATER STERILE IRR 1000ML POUR (IV SOLUTION) ×3 IMPLANT
YANKAUER SUCT BULB TIP NO VENT (SUCTIONS) ×3 IMPLANT

## 2018-01-24 NOTE — Anesthesia Procedure Notes (Signed)
Procedure Name: LMA Insertion Date/Time: 01/24/2018 5:39 PM Performed by: Julian ReilWelty, Kimiko Common F, CRNA Pre-anesthesia Checklist: Patient identified, Emergency Drugs available, Suction available, Patient being monitored and Timeout performed Patient Re-evaluated:Patient Re-evaluated prior to induction Oxygen Delivery Method: Circle system utilized Preoxygenation: Pre-oxygenation with 100% oxygen Induction Type: IV induction Ventilation: Mask ventilation without difficulty LMA: LMA inserted LMA Size: 4.0 Tube type: Oral Number of attempts: 1 Placement Confirmation: positive ETCO2 Tube secured with: Tape Dental Injury: Teeth and Oropharynx as per pre-operative assessment

## 2018-01-24 NOTE — Progress Notes (Signed)
Pt received from PACU. Responds to voice. Ace wrap to right arm CDI with 2 drains attached. Right arm ext. Elevated with foam. Will cont to monitor.

## 2018-01-24 NOTE — Transfer of Care (Signed)
Immediate Anesthesia Transfer of Care Note  Patient: Stacy Mejia  Procedure(s) Performed: REPEAT IRRIGATION AND DEBRIDEMENT RIGHT FOREARM (Right Arm Lower)  Patient Location: PACU  Anesthesia Type:General  Level of Consciousness: awake, oriented and patient cooperative  Airway & Oxygen Therapy: Patient Spontanous Breathing and Patient connected to nasal cannula oxygen  Post-op Assessment: Report given to RN, Post -op Vital signs reviewed and stable and Patient moving all extremities X 4  Post vital signs: Reviewed and stable  Last Vitals:  Vitals Value Taken Time  BP 125/89 01/24/2018  6:46 PM  Temp    Pulse 75 01/24/2018  6:47 PM  Resp 9 01/24/2018  6:47 PM  SpO2 97 % 01/24/2018  6:47 PM  Vitals shown include unvalidated device data.  Last Pain:  Vitals:   01/24/18 1611  TempSrc: Oral  PainSc:       Patients Stated Pain Goal: 2 (01/23/18 0840)  Complications: No apparent anesthesia complications

## 2018-01-24 NOTE — Anesthesia Postprocedure Evaluation (Signed)
Anesthesia Post Note  Patient: Stacy ShoneSarah Mejia  Procedure(s) Performed: REPEAT IRRIGATION AND DEBRIDEMENT RIGHT FOREARM (Right Arm Lower)     Patient location during evaluation: PACU Anesthesia Type: General Level of consciousness: awake and alert Pain management: pain level controlled Vital Signs Assessment: post-procedure vital signs reviewed and stable Respiratory status: spontaneous breathing, nonlabored ventilation, respiratory function stable and patient connected to nasal cannula oxygen Cardiovascular status: blood pressure returned to baseline and stable Postop Assessment: no apparent nausea or vomiting Anesthetic complications: no    Last Vitals:  Vitals:   01/24/18 1847 01/24/18 1902  BP:  125/82  Pulse:  61  Resp:  15  Temp: (!) 36.4 C   SpO2:  95%    Last Pain:  Vitals:   01/24/18 1902  TempSrc:   PainSc: 8                  Tehya Leath COKER

## 2018-01-24 NOTE — Anesthesia Preprocedure Evaluation (Signed)
Anesthesia Evaluation  Patient identified by MRN, date of birth, ID band Patient awake    Reviewed: Allergy & Precautions, NPO status , Patient's Chart, lab work & pertinent test results  History of Anesthesia Complications Negative for: history of anesthetic complications  Airway Mallampati: II  TM Distance: >3 FB Neck ROM: Full    Dental  (+) Partial Lower, Dental Advisory Given   Pulmonary former smoker,    breath sounds clear to auscultation       Cardiovascular negative cardio ROS   Rhythm:Regular     Neuro/Psych  Headaches, Seizures -, Well Controlled,  PSYCHIATRIC DISORDERS Anxiety Depression    GI/Hepatic negative GI ROS, Neg liver ROS,   Endo/Other  Morbid obesity  Renal/GU negative Renal ROS     Musculoskeletal   Abdominal   Peds  Hematology negative hematology ROS (+)   Anesthesia Other Findings   Reproductive/Obstetrics                             Anesthesia Physical  Anesthesia Plan  ASA: II  Anesthesia Plan: General   Post-op Pain Management:    Induction: Intravenous  PONV Risk Score and Plan: 3 and Dexamethasone and Ondansetron  Airway Management Planned: LMA  Additional Equipment: None  Intra-op Plan:   Post-operative Plan: Extubation in OR  Informed Consent: I have reviewed the patients History and Physical, chart, labs and discussed the procedure including the risks, benefits and alternatives for the proposed anesthesia with the patient or authorized representative who has indicated his/her understanding and acceptance.   Dental advisory given  Plan Discussed with: CRNA and Surgeon  Anesthesia Plan Comments:         Anesthesia Quick Evaluation

## 2018-01-24 NOTE — Op Note (Signed)
Please see full dictation.  Patient underwent irrigation and debridement flexor and extensor apparatus with radical tenolysis tenosynovectomy.  There were no complicating features.  Patient will be continued on inpatient status with IV Unasyn.  Will await cultures.  Elevate move and massage fingers.  Ocean Kearley MD

## 2018-01-24 NOTE — Progress Notes (Signed)
Patient ID: Stacy ShoneSarah Barabas, female   DOB: September 22, 1980, 37 y.o.   MRN: [id number] Stable Plan for repeat I&D today Cx pend We are planning surgery for your upper extremity. The risk and benefits of surgery to include risk of bleeding, infection, anesthesia,  damage to normal structures and failure of the surgery to accomplish its intended goals of relieving symptoms and restoring function have been discussed in detail. With this in mind we plan to proceed. I have specifically discussed with the patient the pre-and postoperative regime and the dos and don'ts and risk and benefits in great detail. Risk and benefits of surgery also include risk of dystrophy(CRPS), chronic nerve pain, failure of the healing process to go onto completion and other inherent risks of surgery The relavent the pathophysiology of the disease/injury process, as well as the alternatives for treatment and postoperative course of action has been discussed in great detail with the patient who desires to proceed.  We will do everything in our power to help you (the patient) restore function to the upper extremity. It is a pleasure to see this patient today. Jackson Fetters MD

## 2018-01-25 ENCOUNTER — Encounter (HOSPITAL_COMMUNITY): Payer: Self-pay | Admitting: Orthopedic Surgery

## 2018-01-25 MED FILL — Fentanyl Citrate Preservative Free (PF) Inj 100 MCG/2ML: INTRAMUSCULAR | Qty: 2 | Status: AC

## 2018-01-25 NOTE — Plan of Care (Signed)

## 2018-01-25 NOTE — Op Note (Signed)
NAMEDAIJHA, LEGGIO MEDICAL RECORD [id number] ACCOUNT [id number] DATE OF BIRTH:09/24/1980 FACILITY: [facility] LOCATION: [facility] PHYSICIAN:Anjanette Gilkey M. Sorayah Schrodt, MD  OPERATIVE REPORT  DATE OF PROCEDURE:    PREOPERATIVE DIAGNOSIS:  Status post irrigation and debridement, extensive infection volar and dorsal forearm to include wrist, forearm region.  POSTOPERATIVE DIAGNOSIS:  Status post irrigation and debridement, extensive infection volar and dorsal forearm to include wrist, forearm region.  PROCEDURES: 1.  Radical tenolysis, tenosynovectomy extensor apparatus wrist and forearm. 2.  Radical tenolysis, tenosynovectomy volar right forearm. 3.  Median nerve neurolysis, right forearm. 4.  Ulnar nerve neurolysis, right forearm. 5.  Palmaris longus tenotomy, right forearm and wrist. 6.  Complex closure over a TLS drain, dorsal forearm. 7.  Complex closure over a #10 TLS drain volar forearm, approximately 10 cm in length.  Complex closure on the dorsal aspect was approximately 6-1/2 to 7 cm in length. 8.  Arthrotomy, synovectomy distal radial ulnar joint, right forearm.  SURGEON:  Dominica Severin, MD  ASSISTANT:  None.  COMPLICATIONS:  None.  ANESTHESIA:  General.  TOURNIQUET TIME:  Zero.  INDICATIONS:  The patient is a pleasant female who presents for admission diagnosis.  She is 36.  She had an extensive dog bite.  She had tremendous infection.  Due to this, she presents for repeat washout after initial I and D fasciotomy and other  measures.  OPERATIVE PROCEDURE:  The patient was seen by myself and anesthesia and taken to the operative theater, underwent smooth induction of general anesthetic, prepped and draped in the usual sterile fashion with 2 Hibiclens scrubs, followed by a 10-minute  surgical Betadine scrub, all performed by myself.  A sterile field was secured.  Once this was done, timeout was observed.  I performed a dorsal tenolysis, tenosynovectomy of the ECU, EDM, and EDC tendons.   All underwent very careful and cautious  approach.  Tenolysis, tenosynovectomy was accomplished.  Following this, distal radial ulnar joint underwent an arthrotomy and synovectomy.  This was a wrist joint arthrotomy and synovectomy.  Following this, I made sure all looked well.  The interosseous membrane was healthy.  There were no complicating features.  Following this, I turned attention towards the volar forearm where a tenolysis, tenosynovectomy of the flexor apparatus was accomplished.  The carpal canal contents including FDP, FDS and the FCU as well as FCR all underwent I and D.  Given the bulk and  difficulty with palmaris longus and its less than meaningful use, I performed a tenotomy at this level to decreased bulk and increase less tension with closure.  This was performed without difficulty.  Median nerve neurolysis was accomplished with 4.0  loupe magnification, and an ulnar nerve neurolysis was similarly accomplished.  The ulnar artery was evaluated.  It was pulsatile.  There were no complicating features.  Following this, I then irrigated with 6 L of saline, 3 L on the dorsal aspect.  On  the volar aspect, I and D was accomplished.  Following this, a complex 10 cm wound closure volarly was accomplished.  I left small openings and placed a #10 TLS drain.  I performed a similar 6-7 cm complex wound closure with 3-0 Prolene dorsally with  similar 10 TLS drain.  The patient tolerated this well.  There were no complicating features.  She had excellent soft compartments.  Wound conditions looked excellent.  There were no complicating features.  We will continue IV antibiotics, admission, and monitor closely.  Elevate, movement, massage fingers.  No other measures were  discussed.   Hopefully, we will be able to rehab her aggressively, and we will not have any reaccumulation of infection.  Certainly, today conditions looked pristine owing to the I and D previously.  All questions have been  encouraged and answered.  LN/NUANCE  D:01/24/2018 T:01/25/2018 JOB:[id number]

## 2018-01-25 NOTE — Evaluation (Signed)
Occupational Therapy Evaluation Patient Details Name: Stacy Mejia MRN: [id number] DOB: 1980/12/11 Today's Date: 01/25/2018    History of Present Illness Pt is a 37 y/o female admitted s/p I &D flexor and extensor apparatus with radical tenolysis tenosynovectomy after dog bite to R UE.  PMH signifincat for but not limited to anxiety, chronic pain, depression and seizures.    Clinical Impression   PTA patient was independent and working (as well as taking care of her uncle).  She currently requires minA for UB./LB ADL, supervision for toilet transfers and supervision for bed mobility due to decreased functional use of dominant RUE, pain, decreased activity tolerance and endurance.  She is highly motivated and verbalized understanding education provided on precautions, pain and edema management, positioning of UE, safety, exercises, and ADL compensatory techniques. She will benefit from continued OT services in order to address listed deficits and maximize independence/safety prior to dc home with intermittent assistance.  Will continue to follow while admitted.     Follow Up Recommendations  Follow surgeon's recommendation for DC plan and follow-up therapies;Supervision - Intermittent    Equipment Recommendations  3 in 1 bedside commode    Recommendations for Other Services       Precautions / Restrictions Precautions Required Braces or Orthoses: Other Brace/Splint Other Brace/Splint: post op splint to R forearm Restrictions Weight Bearing Restrictions: Yes RUE Weight Bearing: Weight bear through elbow only Other Position/Activity Restrictions: no weightbearing orders, but conservativly NWB through forearm/hand       Mobility Bed Mobility Overal bed mobility: Needs Assistance Bed Mobility: Supine to Sit;Sit to Supine     Supine to sit: Supervision Sit to supine: Supervision   General bed mobility comments: HOB elevated, increased time and effort  Transfers Overall transfer  level: Needs assistance Equipment used: None Transfers: Sit to/from Stand Sit to Stand: Supervision         General transfer comment: reliant and 1 hand support during transfers, good safety awareness    Balance Overall balance assessment: No apparent balance deficits (not formally assessed)                                         ADL either performed or assessed with clinical judgement   ADL Overall ADL's : Needs assistance/impaired Eating/Feeding: Set up;Sitting   Grooming: Set up;Sitting   Upper Body Bathing: Minimal assistance;Sitting   Lower Body Bathing: Minimal assistance;Sit to/from stand;Cueing for compensatory techniques;Cueing for safety Lower Body Bathing Details (indicate cue type and reason): reviewed safety and precautions Upper Body Dressing : Minimal assistance;Sitting   Lower Body Dressing: Minimal assistance;Sit to/from stand;Cueing for safety Lower Body Dressing Details (indicate cue type and reason): reviewed compenastory techniques and modifications, as well as precautions  Toilet Transfer: Supervision/safety;Comfort height toilet;Ambulation;Grab bars Toilet Transfer Details (indicate cue type and reason): discussed home setup, safety and DME needs Toileting- Clothing Manipulation and Hygiene: Supervision/safety;Sit to/from stand Toileting - Clothing Manipulation Details (indicate cue type and reason): reviewed precautions with R UE, avoiding pulling clothing with RUE    Tub/Shower Transfer Details (indicate cue type and reason): will benefit from education on technique and safety  Functional mobility during ADLs: Supervision/safety General ADL Comments: highly motivated, limited by pain and fatigue in R UE      Vision   Vision Assessment?: No apparent visual deficits     Perception     Praxis  Pertinent Vitals/Pain Pain Assessment: Faces Faces Pain Scale: Hurts even more Pain Location: R UE  Pain Descriptors / Indicators:  Constant;Discomfort;Operative site guarding;Grimacing Pain Intervention(s): Limited activity within patient's tolerance;Repositioned;Ice applied     Hand Dominance Right   Extremity/Trunk Assessment Upper Extremity Assessment Upper Extremity Assessment: RUE deficits/detail;Overall [facility] for tasks assessed RUE Deficits / Details: post op splint to R forearm  RUE: Unable to fully assess due to pain;Unable to fully assess due to immobilization(limited ROM at MCPs, approx 25% flexion at IPs ) RUE Sensation: decreased light touch RUE Coordination: decreased fine motor   Lower Extremity Assessment Lower Extremity Assessment: Overall WFL for tasks assessed   Cervical / Trunk Assessment Cervical / Trunk Assessment: Normal   Communication Communication Communication: No difficulties   Cognition Arousal/Alertness: Awake/alert Behavior During Therapy: WFL for tasks assessed/performed Overall Cognitive Status: Within Functional Limits for tasks assessed                                     General Comments  reviewed elevation, ice, message, and ROM exericse    Exercises Exercises: General Upper Extremity;Hand exercises;Hand activities General Exercises - Upper Extremity Shoulder Flexion: AROM;Right;5 reps;Supine Elbow Flexion: AROM;5 reps;Right;Supine Hand Exercises Digit Composite Flexion: AROM;10 reps;Right;Supine;Limitations Composite Flexion Limitations: approx 25% flexion at IPs, limited range at MCPs   Shoulder Instructions      Home Living Family/patient expects to be discharged to:: Private residence Living Arrangements: Other (Comment)(disabled uncle) Available Help at Discharge: Family;Available PRN/intermittently(reports father can provide some supervision) Type of Home: House Home Access: Stairs to enter Enter[employer] CorporationEntrance Stairs-Number of Steps: 4   Home Layout: One level     Bathroom Shower/Tub: Producer, television/film/videoWalk-in shower   Bathroom Toilet: Standard     Home  Equipment: None   Additional Comments: cares for disabled uncle and 3 dogs      Prior Functioning/Environment Level of Independence: Independent        Comments: 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)         OT Problem List: Decreased range of motion;Decreased activity tolerance;Decreased coordination;Decreased knowledge of use of DME or AE;Decreased knowledge of precautions;Pain;Impaired UE functional use      OT Treatment/Interventions: Self-care/ADL training;Therapeutic exercise;Energy conservation;DME and/or AE instruction;Therapeutic activities;Patient/family education    OT Goals(Current goals can be found in the care plan section) Acute Rehab OT Goals Patient Stated Goal: to feel better OT Goal Formulation: With patient Time For Goal Achievement: 02/08/18 Potential to Achieve Goals: Good  OT Frequency: Min 2X/week   Barriers to D/C:            Co-evaluation              AM-PAC PT "6 Clicks" Daily Activity     Outcome Measure Help from another person eating meals?: A Little Help from another person taking care of personal grooming?: A Little Help from another person toileting, which includes using toliet, bedpan, or urinal?: A Little Help from another person bathing (including washing, rinsing, drying)?: A Little Help from another person to put on and taking off regular upper body clothing?: A Little Help from another person to put on and taking off regular lower body clothing?: A Little 6 Click Score: 18   End of Session    Activity Tolerance: Patient tolerated treatment well Patient left: in bed;with call bell/phone within reach  OT Visit Diagnosis: Pain;Muscle weakness (generalized) (M62.81) Pain - Right/Left: Right  Pain - part of body: Hand;Arm                Time: 1610-9604 OT Time Calculation (min): 26 min Charges:  OT General Charges $OT Visit: 1 Visit OT Evaluation $OT Eval Low Complexity: 1 Low OT Treatments $Self  Care/Home Management : 8-22 mins G-Codes:     Chancy Milroy, OTR/L  Pager [phone]   Chancy Milroy 01/25/2018, 2:08 PM

## 2018-01-25 NOTE — Progress Notes (Signed)
Patient ID: Stacy ShoneSarah Yogi, female   DOB: 1981-02-07, 37 y.o.   MRN: [id number] We have discussed with the patient the issues regarding their infection to the extremity. We will continue antibiotics and await culture results. Often times it will take 3-5 days for cultures to become final. During this time we will typically have the patient on intravenous antibiotics until we can find a parenteral route of antibiotic regime specific for the bacteria or organism isolated. We have discussed with the patient the need for daily irrigation and debridement as well as therapy to the area. We have discussed with the patient the necessity of range of motion to the involved joints as discussed today. We have discussed with the patient the unpredictability of infections at times. We'll continue to work towards good pain control and restoration of function. The patient understands the need for meticulous wound care and the necessity of proper followup.  The possible complications of stiffness (loss of motion), resistant infection, possible deep bone infection, possible chronic pain issues, possible need for multiple surgeries and even amputation.  With this in mind the patient understands our goal is to eradicate the infection to quiesence. We will continue to work towards these goals.   I removed her drains today without difficulty.  Cultures are still pending.  I have encouraged her on finger range of motion massage and edema control as well as elevation.  We will plan for likely discharge Friday.  I will consider dressing change Wednesday or Thursday depending on her level of comfort and status.  We discussed all issues at length.  The patient is alert and oriented in no acute distress. The patient complains of pain in the affected upper extremity.  The patient is noted to have a normal HEENT exam. Lung fields show equal chest expansion and no shortness of breath. Abdomen exam is nontender without  distention. Lower extremity examination does not show any fracture dislocation or blood clot symptoms. Pelvis is stable and the neck and back are stable and nontender.  Cassanda Walmer MD

## 2018-01-26 NOTE — Progress Notes (Signed)
Patient ID: Stacy ShoneSarah Hartfield, female   DOB: 01-Feb-1981, 37 y.o.   MRN: [id number] Stable  VSS  Tol po The patient is alert and oriented in no acute distress. The patient complains of pain in the affected upper extremity.  The patient is noted to have a normal HEENT exam. Lung fields show equal chest expansion and no shortness of breath. Abdomen exam is nontender without distention. Lower extremity examination does not show any fracture dislocation or blood clot symptoms. Pelvis is stable and the neck and back are stable and nontender.  We have discussed with the patient the issues regarding their infection to the extremity. We will continue antibiotics and await culture results. Often times it will take 3-5 days for cultures to become final. During this time we will typically have the patient on intravenous antibiotics until we can find a parenteral route of antibiotic regime specific for the bacteria or organism isolated. We have discussed with the patient the need for daily irrigation and debridement as well as therapy to the area. We have discussed with the patient the necessity of range of motion to the involved joints as discussed today. We have discussed with the patient the unpredictability of infections at times. We'll continue to work towards good pain control and restoration of function. The patient understands the need for meticulous wound care and the necessity of proper followup.  The possible complications of stiffness (loss of motion), resistant infection, possible deep bone infection, possible chronic pain issues, possible need for multiple surgeries and even amputation.  With this in mind the patient understands our goal is to eradicate the infection to quiesence. We will continue to work towards these goals.  Plan dressing change tomorrow Amanda PeaGramig MD

## 2018-01-26 NOTE — Progress Notes (Signed)
Occupational Therapy Treatment Patient Details Name: Stacy Mejia MRN: [id number] DOB: 16-Apr-1981 Today's Date: 01/26/2018    History of present illness Pt is a 38 y/o female admitted s/p I &D flexor and extensor apparatus with radical tenolysis tenosynovectomy after dog bite to R UE.  PMH signifincat for but not limited to anxiety, chronic pain, depression and seizures.    OT comments  Patient session focused on R UE ROM and exercises and energy conservation education.  She continues to be limited by pain in R UE, improved digit flexion at IP but continues to be limited at MCPs.  Continued education on ROM exercises at hand, elbow and shoulder, edema and pain management with positioning, massage and ice, ADL compensatory techniques, and initiated educated on energy conservation and safety.   Patient verbalizes understanding, reports completing finger ROM multiple times a day.  Plan for next session on ADLs and further education on compensatory techniques due to R UE restrictions.  Will continue to follow while admitted.    Follow Up Recommendations  Follow surgeon's recommendation for DC plan and follow-up therapies;Supervision - Intermittent    Equipment Recommendations  3 in 1 bedside commode    Recommendations for Other Services      Precautions / Restrictions Precautions Required Braces or Orthoses: Other Brace/Splint Other Brace/Splint: post op splint to R forearm Restrictions Weight Bearing Restrictions: Yes RUE Weight Bearing: Weight bear through elbow only Other Position/Activity Restrictions: no weightbearing orders, but conservativly NWB through forearm/hand        Mobility Bed Mobility               General bed mobility comments: seated EOB   Transfers Overall transfer level: Needs assistance Equipment used: None             General transfer comment: patient independently repositoning at EOB multiple times during session     Balance                                           ADL either performed or assessed with clinical judgement   ADL Overall ADL's : Needs assistance/impaired         Upper Body Bathing: Minimal assistance;Sitting Upper Body Bathing Details (indicate cue type and reason): reviewed bathing techniques, waterproofing R UE, and safety  Lower Body Bathing: Supervison/ safety;Sit to/from stand Lower Body Bathing Details (indicate cue type and reason): reviewed energy conservation and safety techniques                             Vision       Perception     Praxis      Cognition Arousal/Alertness: Awake/alert Behavior During Therapy: WFL for tasks assessed/performed Overall Cognitive Status: Within Functional Limits for tasks assessed                                          Exercises General Exercises - Upper Extremity Shoulder Flexion: AROM;10 reps;Right;Seated Elbow Flexion: AROM;10 reps;Right;Seated Hand Exercises Digit Composite Flexion: AROM;10 reps;Right;Limitations;Seated Composite Flexion Limitations: approx 50% flexion at IPs, limited range at MCPs Composite Extension: AROM;Right;10 reps;Seated   Shoulder Instructions       General Comments session focus on R UE precautions, exericses and energy conservation  Pertinent Vitals/ Pain       Pain Assessment: Faces Faces Pain Scale: Hurts little more Pain Location: R UE  Pain Descriptors / Indicators: Constant;Discomfort;Operative site guarding Pain Intervention(s): Limited activity within patient's tolerance;Monitored during session  Home Living                                          Prior Functioning/Environment              Frequency  Min 2X/week        Progress Toward Goals  OT Goals(current goals can now be found in the care plan section)  Progress towards OT goals: Progressing toward goals  Acute Rehab OT Goals Patient Stated Goal: to feel better OT  Goal Formulation: With patient Time For Goal Achievement: 02/08/18 Potential to Achieve Goals: Good  Plan Discharge plan remains appropriate;Frequency remains appropriate    Co-evaluation                 AM-PAC PT "6 Clicks" Daily Activity     Outcome Measure   Help from another person eating meals?: A Little Help from another person taking care of personal grooming?: A Little Help from another person toileting, which includes using toliet, bedpan, or urinal?: None Help from another person bathing (including washing, rinsing, drying)?: A Little Help from another person to put on and taking off regular upper body clothing?: A Little Help from another person to put on and taking off regular lower body clothing?: A Little 6 Click Score: 19    End of Session    OT Visit Diagnosis: Pain;Muscle weakness (generalized) (M62.81) Pain - Right/Left: Right Pain - part of body: Hand;Arm   Activity Tolerance Patient tolerated treatment well   Patient Left with call bell/phone within reach;with family/visitor present(seated EOB)   Nurse Communication          Time: 1610-9604: 1113-1130 OT Time Calculation (min): 17 min  Charges: OT General Charges $OT Visit: 1 Visit OT Treatments $Self Care/Home Management : 8-22 mins  Chancy Milroyhristie S Oliviagrace Crisanti, OTR/L  Pager [phone]    Chancy MilroyChristie S Clara Herbison 01/26/2018, 11:44 AM

## 2018-01-27 LAB — AEROBIC/ANAEROBIC CULTURE (SURGICAL/DEEP WOUND): CULTURE: NO GROWTH

## 2018-01-27 LAB — AEROBIC/ANAEROBIC CULTURE W GRAM STAIN (SURGICAL/DEEP WOUND)

## 2018-01-27 NOTE — Progress Notes (Signed)
Patient ID: Stacy ShoneSarah Mejia, female   DOB: 05-03-1981, 37 y.o.   MRN: [id number] Patient is doing quite well.  No signs of infection or dystrophy.  I changed her bandage today the wound looks clean dry and intact there is no complicating features.  I discussed with her likely discharge tomorrow on Augmentin.  Overall I think she looks very well.  Range of motion sensation and her examination is coming along nicely.  We discussed all issues and transition to care at home followed by a follow-up in my office in 3 days for therapeutic endeavors.  All questions have been encouraged and answered.

## 2018-01-28 NOTE — Discharge Summary (Signed)
Physician Discharge Summary  Patient ID: Stacy Mejia MRN: [id number] DOB/AGE: 09/03/1980 37 y.o.  Admit date: 01/22/2018 Discharge date:   Admission Diagnoses: INFECTION RIGHT FOREARM Past Medical History:  Diagnosis Date  . Allergy   . Anxiety   . Chronic pain   . Depression   . Interstitial cystitis   . Seizures (HCC) 06/2015  . Substance abuse [facility])     Discharge Diagnoses:  Active Problems:   Dog bite of arm, right, initial encounter   Surgeries: Procedure(s): REPEAT IRRIGATION AND DEBRIDEMENT RIGHT FOREARM on 01/24/2018    Consultants:   Discharged Condition: Improved  Hospital Course: Stacy Mejia is an 37 y.o. female who was admitted 01/22/2018 with a chief complaint of  Chief Complaint  Patient presents with  . Animal Bite  , and found to have a diagnosis of INFECTION RIGHT FOREARM.  They were brought to the operating room on 01/24/2018 and underwent Procedure(s): REPEAT IRRIGATION AND DEBRIDEMENT RIGHT FOREARM.    They were given perioperative antibiotics:  Anti-infectives (From admission, onward)   Start     Dose/Rate Route Frequency Ordered Stop   01/23/18 0200  Ampicillin-Sulbactam (UNASYN) 3 g in sodium chloride 0.9 % 100 mL IVPB     3 g 200 mL/hr over 30 Minutes Intravenous Every 6 hours 01/22/18 2304     01/22/18 1845  Ampicillin-Sulbactam (UNASYN) 3 g in sodium chloride 0.9 % 100 mL IVPB     3 g 200 mL/hr over 30 Minutes Intravenous  Once 01/22/18 1832 01/22/18 2004    .  They were given sequential compression devices, early ambulation, and Other (comment) for DVT prophylaxis.  Recent vital signs:  Patient Vitals for the past 24 hrs:  BP Temp Temp src Pulse Resp SpO2  01/28/18 0505 105/67 98.4 F (36.9 C) Oral 72 18 93 %  01/28/18 0020 127/83 98.2 F (36.8 C) - 81 18 100 %  01/27/18 2205 (!) 153/89 98.3 F (36.8 C) Oral 88 20 99 %  01/27/18 1458 113/78 98.1 F (36.7 C) Oral 75 17 97 %  .  Recent laboratory studies: No results  found.  Discharge Medications:   Allergies as of 01/28/2018      Reactions   Tequin [gatifloxacin] Anaphylaxis   Throat closes up   Adhesive [tape] Itching, Rash   Latex Rash      Medication List    TAKE these medications   ALPRAZolam 1 MG tablet Commonly known as:  XANAX Take 1 mg by mouth 3 (three) times daily as needed for anxiety.   amoxicillin-clavulanate 875-125 MG tablet Commonly known as:  AUGMENTIN Take 1 tablet by mouth 2 (two) times daily.   aspirin-acetaminophen-caffeine 250-250-65 MG tablet Commonly known as:  EXCEDRIN MIGRAINE Take 2 tablets by mouth every 6 (six) hours as needed for headache.   cyclobenzaprine 5 MG tablet Commonly known as:  FLEXERIL Take 10 mg by mouth at bedtime.   fexofenadine 60 MG tablet Commonly known as:  ALLEGRA Take 60 mg by mouth daily as needed for allergies.   gabapentin 800 MG tablet Commonly known as:  NEURONTIN Take 800 mg by mouth 4 (four) times daily as needed (pain, spasms).   lamoTRIgine 200 MG tablet Commonly known as:  LAMICTAL Take 1 tablet (200 mg total) by mouth 2 (two) times daily.   medroxyPROGESTERone 150 MG/ML injection Commonly known as:  DEPO-PROVERA Inject 150 mg into the muscle every 3 (three) months.   pentosan polysulfate 100 MG capsule Commonly known as:  ELMIRON Take  100 mg by mouth daily as needed (bladder).   promethazine 25 MG tablet Commonly known as:  PHENERGAN Take 25 mg by mouth every 6 (six) hours as needed for nausea.   propranolol 40 MG tablet Commonly known as:  INDERAL Take 1 tablet twice a day What changed:    how much to take  how to take this  when to take this  additional instructions   rOPINIRole 1 MG tablet Commonly known as:  REQUIP Take 1 tablet 2 hours prior to bedtime What changed:    how much to take  how to take this  when to take this  additional instructions   SUMAtriptan 25 MG tablet Commonly known as:  IMITREX Take 1 tablet at onset of  migraine. Do not take more than 2-3 a week   traZODone 50 MG tablet Commonly known as:  DESYREL Take 50 mg by mouth at bedtime.       Diagnostic Studies: Ct Lumbar Spine W Contrast  Result Date: 01/10/2018 CLINICAL DATA:  Bilateral lower back pain radiating into both hips with bilateral lateral thigh numbness. Symptoms began approximately 2 months ago after feeling a pop in her lower back while at work. EXAM: LUMBAR MYELOGRAM CT LUMBAR MYELOGRAM FLUOROSCOPY TIME:  Radiation Exposure Index (as provided by the fluoroscopic device): 178.41 Gy*m2 Fluoroscopy Time:  6 seconds Number of Acquired Images:  16 PROCEDURE: After thorough discussion of risks and benefits of the procedure including bleeding, infection, injury to nerves, blood vessels, adjacent structures as well as headache and CSF leak, written and oral informed consent was obtained. Consent was obtained by Dr. Malachi Pro. Time out form was completed. Patient was positioned prone on the fluoroscopy table. Local anesthesia was provided with 1% lidocaine without epinephrine after prepped and draped in the usual sterile fashion. Puncture was performed at L3-L4 using a 3 1/2 inch 22-gauge spinal needle via left paramedian approach. Using a single pass through the dura, the needle was placed within the thecal sac, with return of clear CSF. 15 mL of Isovue M-200 was injected into the thecal sac, with normal opacification of the nerve roots and cauda equina consistent with free flow within the subarachnoid space. I personally performed the lumbar puncture and administered the intrathecal contrast. I also personally supervised acquisition of the myelogram images. TECHNIQUE: Contiguous axial images were obtained through the lumbar spine after the intrathecal infusion of contrast. Coronal and sagittal reconstructions were obtained of the axial image sets. COMPARISON:  None. FINDINGS: LUMBAR MYELOGRAM FINDINGS: Five lumbar type vertebral bodies. No acute  fracture or subluxation. Vertebral body heights are preserved. Alignment is normal. No dynamic instability. Intervertebral disc spaces are maintained. Trace ventral extradural defects at T11-T12, L3-L4, and L4-L5. No nerve root effacement. CT LUMBAR MYELOGRAM FINDINGS: Segmentation: Standard. Alignment: Normal. Vertebrae: No acute fracture or focal pathologic process. Conus medullaris and cauda equina: Conus extends to the L1-L2 level. Conus and cauda equina appear normal. Paraspinal and other soft tissues: Partially visualized bladder stimulator. Otherwise negative. Disc levels: T12-L1:  Negative. L1-L2:  Negative. L2-L3:  Negative. L3-L4:  Negative. L4-L5:  Negative. L5-S1:  Small broad-based disc protrusion.  No stenosis. IMPRESSION: 1. Minimal degenerative disc disease at L5-S1. No significant spinal canal or neuroforaminal stenosis at any level. No explanation for the patient's symptoms. Electronically Signed   By: Obie Dredge M.D.   On: 01/10/2018 11:17   Dg Myelography Lumbar Inj Lumbosacral  Result Date: 01/10/2018 CLINICAL DATA:  Bilateral lower back pain radiating into both  hips with bilateral lateral thigh numbness. Symptoms began approximately 2 months ago after feeling a pop in her lower back while at work. EXAM: LUMBAR MYELOGRAM CT LUMBAR MYELOGRAM FLUOROSCOPY TIME:  Radiation Exposure Index (as provided by the fluoroscopic device): 178.41 Gy*m2 Fluoroscopy Time:  6 seconds Number of Acquired Images:  16 PROCEDURE: After thorough discussion of risks and benefits of the procedure including bleeding, infection, injury to nerves, blood vessels, adjacent structures as well as headache and CSF leak, written and oral informed consent was obtained. Consent was obtained by Dr. Malachi Pro. Time out form was completed. Patient was positioned prone on the fluoroscopy table. Local anesthesia was provided with 1% lidocaine without epinephrine after prepped and draped in the usual sterile fashion. Puncture  was performed at L3-L4 using a 3 1/2 inch 22-gauge spinal needle via left paramedian approach. Using a single pass through the dura, the needle was placed within the thecal sac, with return of clear CSF. 15 mL of Isovue M-200 was injected into the thecal sac, with normal opacification of the nerve roots and cauda equina consistent with free flow within the subarachnoid space. I personally performed the lumbar puncture and administered the intrathecal contrast. I also personally supervised acquisition of the myelogram images. TECHNIQUE: Contiguous axial images were obtained through the lumbar spine after the intrathecal infusion of contrast. Coronal and sagittal reconstructions were obtained of the axial image sets. COMPARISON:  None. FINDINGS: LUMBAR MYELOGRAM FINDINGS: Five lumbar type vertebral bodies. No acute fracture or subluxation. Vertebral body heights are preserved. Alignment is normal. No dynamic instability. Intervertebral disc spaces are maintained. Trace ventral extradural defects at T11-T12, L3-L4, and L4-L5. No nerve root effacement. CT LUMBAR MYELOGRAM FINDINGS: Segmentation: Standard. Alignment: Normal. Vertebrae: No acute fracture or focal pathologic process. Conus medullaris and cauda equina: Conus extends to the L1-L2 level. Conus and cauda equina appear normal. Paraspinal and other soft tissues: Partially visualized bladder stimulator. Otherwise negative. Disc levels: T12-L1:  Negative. L1-L2:  Negative. L2-L3:  Negative. L3-L4:  Negative. L4-L5:  Negative. L5-S1:  Small broad-based disc protrusion.  No stenosis. IMPRESSION: 1. Minimal degenerative disc disease at L5-S1. No significant spinal canal or neuroforaminal stenosis at any level. No explanation for the patient's symptoms. Electronically Signed   By: Obie Dredge M.D.   On: 01/10/2018 11:17    They benefited maximally from their hospital stay and there were no complications.     Disposition: Discharge disposition: 01-Home or  Self Care      Discharge Instructions    Call MD / Call 911   Complete by:  As directed    If you experience chest pain or shortness of breath, CALL 911 and be transported to the hospital emergency room.  If you develope a fever above 101 F, pus (white drainage) or increased drainage or redness at the wound, or calf pain, call your surgeon's office.   Constipation Prevention   Complete by:  As directed    Drink plenty of fluids.  Prune juice may be helpful.  You may use a stool softener, such as Colace (over the counter) 100 mg twice a day.  Use MiraLax (over the counter) for constipation as needed.   Diet - low sodium heart healthy   Complete by:  As directed    Increase activity slowly as tolerated   Complete by:  As directed      Follow-up Information    Dominica Severin, MD Follow up in 3 day(s).   Specialty:  Orthopedic Surgery Why:  We will call for your follow-up evaluation Monday Contact information: [address] [place] [address] [phone]          Patient was admitted Saturday night for a infected dog bite. She underwent a series of surgical debridement. At the time of her dressing change yesterday the wound was clean dry and intact. She is sensate and is coming along fairly well.  I would recommend Augmentin twice a day A 75 mg twice a day and have given her 35 oxycodone to take when necessary pain  I see her Monday for dressing change.  At the time of discharge she was alert oriented no signs of DVT, UTI, pulmonary abnormality.  Final diagnosis status post infected dog bite with infectious tenosynovitis and deep abscess requiring multiple debridements and surgical reconstruction.  I do feel the patient has a bit of a long road in front of her she'll be out of work for at least 3 to formal weeks.  I have discussed with her all issues and discussed all issues with her family.  Signed: Dionne AnoWilliam M Sherleen Pangborn III 01/28/2018, 7:17 AM

## 2018-01-28 NOTE — Discharge Instructions (Signed)
Please keep your bandage clean and dry. My office will call you for your follow-up appointment Monday.  Please  elevate move and massage her fingers and use the pain medicine sparingly.  Please continue to take the Augmentin antibiotic twice a day  Please call for any problems  Keep bandage clean and dry.  Call for any problems.  No smoking.  Criteria for driving a car: you should be off your pain medicine for 7-8 hours, able to drive one handed(confident), thinking clearly and feeling able in your judgement to drive. Continue elevation as it will decrease swelling.  If instructed by MD move your fingers within the confines of the bandage/splint.  Use ice if instructed by your MD. Call immediately for any sudden loss of feeling in your hand/arm or change in functional abilities of the extremity.We recommend that you to take vitamin C 1000 mg a day to promote healing. We also recommend that if you require  pain medicine that you take a stool softener to prevent constipation as most pain medicines will have constipation side effects. We recommend either Peri-Colace or Senokot and recommend that you also consider adding MiraLAX as well to prevent the constipation affects from pain medicine if you are required to use them. These medicines are over the counter and may be purchased at a local pharmacy. A cup of yogurt and a probiotic can also be helpful during the recovery process as the medicines can disrupt your intestinal environment.

## 2018-01-28 NOTE — Plan of Care (Signed)

## 2018-01-28 NOTE — Progress Notes (Signed)
Occupational Therapy Treatment Patient Details Name: Stacy ShoneSarah Micalizzi MRN: [id number] DOB: [birth year] Today's Date: 01/28/2018    History of present illness Pt is a 37 y/o female admitted s/p I &D flexor and extensor apparatus with radical tenolysis tenosynovectomy after dog bite to R UE.  PMH signifincat for but not limited to anxiety, chronic pain, depression and seizures.    OT comments  Pt reports she showered earlier without assist, and was able to dress self mod I, but experienced increased pain.  Instructed her in compensatory strategies to reduce pain.  Reviewed need for elevation and HEP to increase PIP extension and MCP flexion.  Also discussed options for tub seats.    Follow Up Recommendations  Follow surgeon's recommendation for DC plan and follow-up therapies;Supervision - Intermittent    Equipment Recommendations  None recommended by OT(Pt reports she will acquire a seat for shower if she needs i)    Recommendations for Other Services      Precautions / Restrictions Precautions Required Braces or Orthoses: Other Brace/Splint Other Brace/Splint: post op splint to R forearm Restrictions Weight Bearing Restrictions: Yes RUE Weight Bearing: Weight bear through elbow only       Mobility Bed Mobility               General bed mobility comments: seated EOB   Transfers Overall transfer level: Modified independent                    Balance Overall balance assessment: No apparent balance deficits (not formally assessed)                                         ADL either performed or assessed with clinical judgement   ADL Overall ADL's : Modified independent     Grooming: Wash/dry hands;Wash/dry face;Oral care;Brushing hair;Modified independent;Standing   Upper Body Bathing: Set up;Sitting   Lower Body Bathing: Modified independent;Sit to/from stand   Upper Body Dressing : Modified independent;Sitting Upper Body Dressing Details  (indicate cue type and reason): Pt reports she donned bra and shirt earlier  Lower Body Dressing: Modified independent;Sit to/from stand Lower Body Dressing Details (indicate cue type and reason): Pt reports she will wear elastic waist banded pants  and no socks initially  Toilet Transfer: Modified Independent;Ambulation   Toileting- Clothing Manipulation and Hygiene: Modified independent;Sit to/from stand Toileting - Clothing Manipulation Details (indicate cue type and reason): using Lt UE        General ADL Comments: Pt reports she showered today, and did not require assist.  She was able to verbalize correct technique for "bagging'/covering Rt UE to prevent splint from getting wet.   Discussed one handed dressing techniques to limit pain from Rt UE, but encouraged gentle movement.   Discussed options for tub seats      Vision       Perception     Praxis      Cognition Arousal/Alertness: Awake/alert Behavior During Therapy: WFL for tasks assessed/performed Overall Cognitive Status: Within Functional Limits for tasks assessed                                          Exercises Exercises: Other exercises Other Exercises Other Exercises: Instructed pt in continued elevation of Rt UE.  Also instructed her in  gentle PROM of PIPs to achieve full extension, and overpressure to MCPs to maximize ROM of digits.  She demonstrated understanding    Shoulder Instructions       General Comments Pt concerned about managing dogs.  Instructed her to have family assist with feeding, and to have family leash dogs initially upon her return home to prevent injury     Pertinent Vitals/ Pain       Pain Assessment: Faces Faces Pain Scale: Hurts little more Pain Location: R UE  Pain Descriptors / Indicators: Constant;Discomfort;Operative site guarding Pain Intervention(s): Monitored during session  Home Living                                          Prior  Functioning/Environment              Frequency  Min 2X/week        Progress Toward Goals  OT Goals(current goals can now be found in the care plan section)  Progress towards OT goals: Progressing toward goals     Plan Discharge plan remains appropriate;Frequency remains appropriate;Equipment recommendations need to be updated    Co-evaluation                 AM-PAC PT "6 Clicks" Daily Activity     Outcome Measure   Help from another person eating meals?: None Help from another person taking care of personal grooming?: None Help from another person toileting, which includes using toliet, bedpan, or urinal?: None Help from another person bathing (including washing, rinsing, drying)?: None Help from another person to put on and taking off regular upper body clothing?: None Help from another person to put on and taking off regular lower body clothing?: None 6 Click Score: 24    End of Session    OT Visit Diagnosis: Pain;Muscle weakness (generalized) (M62.81) Pain - Right/Left: Right Pain - part of body: Hand;Arm   Activity Tolerance Patient tolerated treatment well   Patient Left in bed;with call bell/phone within reach(EOB )   Nurse Communication          Time: 1610-9604 OT Time Calculation (min): 15 min  Charges: OT General Charges $OT Visit: 1 Visit OT Treatments $Self Care/Home Management : 8-22 mins  [employer], OTR/L [phone]    Jeani Hawking M 01/28/2018, 12:34 PM

## 2018-01-28 NOTE — Care Management Note (Signed)
Case Management Note  Patient Details  Name: Stacy Mejia MRN: [id number] Date of Birth: 1980-12-07  Subjective/Objective:           Spoke w patient. She states her plan, per Dr Amanda PeaGramig this morning, is to leave dressing in place until he assesses it in the office Monday. No HH needs at this time.  Will DC to home.         Action/Plan:   Expected Discharge Date:  01/28/18               Expected Discharge Plan:  Home/Self Care  In-House Referral:     Discharge planning Services     Post Acute Care Choice:    Choice offered to:     DME Arranged:    DME Agency:     HH Arranged:    HH Agency:     Status of Service:  Completed, signed off  If discussed at [employer] Length of Stay Meetings, dates discussed:    Additional Comments:  Lawerance SabalDebbie Jian Hodgman, RN 01/28/2018, 10:10 AM

## 2018-01-28 NOTE — Progress Notes (Signed)
Patient ID: Stacy ShoneSarah Mejia, female   DOB: [birth year], 37 y.o.   MRN: [id number] Patient seen and examined. Patient is ready for discharge.  Interestingly no growth on her cultures which is quite surprising given the severity of her infection and the absolute inoculation of bacteria from a dog's mouth which became horribly infected. I would recommend Augmentin 2 weeks 875 twice a day. Please see discharge summary. All questions have been addressed. Dressing change yesterday went quite well.  Jazline Cumbee M.D.

## 2018-01-31 DIAGNOSIS — Z5189 Encounter for other specified aftercare: Secondary | ICD-10-CM | POA: Insufficient documentation

## 2018-01-31 NOTE — Anesthesia Postprocedure Evaluation (Signed)
Anesthesia Post Note  Patient: Stacy Mejia  Procedure(s) Performed: IRRIGATION AND DEBRIDEMENT OF FOREARM (Right )     Patient location during evaluation: PACU Anesthesia Type: General Level of consciousness: awake and alert Pain management: pain level controlled Vital Signs Assessment: post-procedure vital signs reviewed and stable Respiratory status: spontaneous breathing, nonlabored ventilation, respiratory function stable and patient connected to nasal cannula oxygen Cardiovascular status: blood pressure returned to baseline and stable Postop Assessment: no apparent nausea or vomiting Anesthetic complications: no    Last Vitals:  Vitals:   01/28/18 0020 01/28/18 0505  BP: 127/83 105/67  Pulse: 81 72  Resp: 18 18  Temp: 36.8 C 36.9 C  SpO2: 100% 93%    Last Pain:  Vitals:   01/28/18 1630  TempSrc:   PainSc: 8                  Shalissa Easterwood

## 2018-07-15 ENCOUNTER — Ambulatory Visit: Payer: Self-pay | Admitting: Neurology

## 2018-07-15 DIAGNOSIS — Z029 Encounter for administrative examinations, unspecified: Secondary | ICD-10-CM

## 2018-07-18 ENCOUNTER — Encounter: Payer: Self-pay | Admitting: Neurology

## 2018-09-24 ENCOUNTER — Other Ambulatory Visit: Payer: Self-pay | Admitting: Neurology

## 2018-12-13 ENCOUNTER — Other Ambulatory Visit: Payer: Self-pay | Admitting: Neurology

## 2018-12-13 DIAGNOSIS — G40309 Generalized idiopathic epilepsy and epileptic syndromes, not intractable, without status epilepticus: Secondary | ICD-10-CM

## 2019-01-11 ENCOUNTER — Other Ambulatory Visit: Payer: Self-pay | Admitting: Neurology

## 2019-03-15 ENCOUNTER — Ambulatory Visit: Payer: Self-pay | Admitting: Neurology

## 2019-05-30 ENCOUNTER — Telehealth (HOSPITAL_COMMUNITY): Payer: Self-pay | Admitting: Psychiatry

## 2019-05-30 NOTE — Telephone Encounter (Signed)
D:  Stacy Mejia (referral coordinator) referred pt to [facility].  A:  Placed call to orient patient and provide her with a start date, but pt declined.  Pt is interested in knowing who referred her to [facility] and why.  Will inform Jonelle Sidle.

## 2019-06-23 ENCOUNTER — Other Ambulatory Visit: Payer: Self-pay | Admitting: Neurology

## 2019-07-18 ENCOUNTER — Other Ambulatory Visit: Payer: Self-pay | Admitting: Neurology

## 2019-07-18 ENCOUNTER — Other Ambulatory Visit: Payer: Self-pay

## 2019-07-18 DIAGNOSIS — G40309 Generalized idiopathic epilepsy and epileptic syndromes, not intractable, without status epilepticus: Secondary | ICD-10-CM

## 2019-07-18 MED ORDER — PROPRANOLOL HCL 40 MG PO TABS: 40.0000 mg | ORAL_TABLET | Freq: Two times a day (BID) | ORAL | 0 refills | Status: DC

## 2019-07-18 MED ORDER — LAMOTRIGINE 200 MG PO TABS: 200.0000 mg | ORAL_TABLET | Freq: Two times a day (BID) | ORAL | 0 refills | Status: DC

## 2019-07-18 NOTE — Telephone Encounter (Signed)
Refill sent in for Lamictal and Propanolol, pt has an appointment scheduled for 08/31/19

## 2019-07-18 NOTE — Telephone Encounter (Signed)
Patient is needing a refill on the lamictal and  propanolol medications to the pharm on file. Please send these in as patient has been scheduled for a F/U on 08/31/19. Thanks!

## 2019-08-18 ENCOUNTER — Other Ambulatory Visit: Payer: Self-pay | Admitting: Neurology

## 2019-08-31 ENCOUNTER — Telehealth (INDEPENDENT_AMBULATORY_CARE_PROVIDER_SITE_OTHER): Payer: BC Managed Care – PPO | Admitting: Neurology

## 2019-08-31 ENCOUNTER — Other Ambulatory Visit: Payer: Self-pay

## 2019-08-31 ENCOUNTER — Encounter: Payer: Self-pay | Admitting: Neurology

## 2019-08-31 DIAGNOSIS — G43009 Migraine without aura, not intractable, without status migrainosus: Secondary | ICD-10-CM | POA: Diagnosis not present

## 2019-08-31 DIAGNOSIS — G2581 Restless legs syndrome: Secondary | ICD-10-CM

## 2019-08-31 DIAGNOSIS — R251 Tremor, unspecified: Secondary | ICD-10-CM

## 2019-08-31 DIAGNOSIS — G40309 Generalized idiopathic epilepsy and epileptic syndromes, not intractable, without status epilepticus: Secondary | ICD-10-CM | POA: Diagnosis not present

## 2019-08-31 MED ORDER — PROPRANOLOL HCL 40 MG PO TABS: 40.0000 mg | ORAL_TABLET | Freq: Two times a day (BID) | ORAL | 3 refills | Status: DC

## 2019-08-31 MED ORDER — LAMOTRIGINE 200 MG PO TABS: 200.0000 mg | ORAL_TABLET | Freq: Two times a day (BID) | ORAL | 3 refills | Status: DC

## 2019-08-31 MED ORDER — PRAMIPEXOLE DIHYDROCHLORIDE 0.125 MG PO TABS: ORAL_TABLET | ORAL | 11 refills | Status: DC

## 2019-08-31 MED ORDER — SUMATRIPTAN SUCCINATE 25 MG PO TABS: ORAL_TABLET | ORAL | 5 refills | Status: DC
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
## 2020-02-13 ENCOUNTER — Telehealth: Payer: Self-pay | Admitting: Neurology

## 2020-02-13 NOTE — Telephone Encounter (Signed)
Patient called in and left a message. Wanted someone to give her a call back about a situation she is having.

## 2020-03-20 DIAGNOSIS — F411 Generalized anxiety disorder: Secondary | ICD-10-CM | POA: Diagnosis not present

## 2020-03-20 DIAGNOSIS — F321 Major depressive disorder, single episode, moderate: Secondary | ICD-10-CM | POA: Diagnosis not present

## 2020-03-22 DIAGNOSIS — Z3042 Encounter for surveillance of injectable contraceptive: Secondary | ICD-10-CM | POA: Diagnosis not present

## 2020-03-22 DIAGNOSIS — Z09 Encounter for follow-up examination after completed treatment for conditions other than malignant neoplasm: Secondary | ICD-10-CM | POA: Diagnosis not present

## 2020-03-22 DIAGNOSIS — N3010 Interstitial cystitis (chronic) without hematuria: Secondary | ICD-10-CM | POA: Diagnosis not present

## 2020-03-22 DIAGNOSIS — R3915 Urgency of urination: Secondary | ICD-10-CM | POA: Diagnosis not present

## 2020-03-22 DIAGNOSIS — N9489 Other specified conditions associated with female genital organs and menstrual cycle: Secondary | ICD-10-CM | POA: Diagnosis not present

## 2020-04-01 ENCOUNTER — Telehealth (INDEPENDENT_AMBULATORY_CARE_PROVIDER_SITE_OTHER): Payer: BC Managed Care – PPO | Admitting: Neurology

## 2020-04-01 ENCOUNTER — Encounter: Payer: Self-pay | Admitting: Neurology

## 2020-04-01 ENCOUNTER — Other Ambulatory Visit: Payer: Self-pay

## 2020-04-01 VITALS — Ht 62.0 in | Wt 180.0 lb

## 2020-04-01 DIAGNOSIS — R251 Tremor, unspecified: Secondary | ICD-10-CM

## 2020-04-01 DIAGNOSIS — G2581 Restless legs syndrome: Secondary | ICD-10-CM | POA: Diagnosis not present

## 2020-04-01 DIAGNOSIS — G40309 Generalized idiopathic epilepsy and epileptic syndromes, not intractable, without status epilepticus: Secondary | ICD-10-CM | POA: Diagnosis not present

## 2020-04-01 DIAGNOSIS — G43009 Migraine without aura, not intractable, without status migrainosus: Secondary | ICD-10-CM

## 2020-04-01 MED ORDER — SUMATRIPTAN SUCCINATE 25 MG PO TABS
ORAL_TABLET | ORAL | 11 refills | Status: DC
Start: 2020-04-01 — End: 2021-11-20

## 2020-04-01 MED ORDER — PRAMIPEXOLE DIHYDROCHLORIDE 0.125 MG PO TABS
ORAL_TABLET | ORAL | 11 refills | Status: DC
Start: 2020-04-01 — End: 2020-09-09

## 2020-04-01 MED ORDER — PROPRANOLOL HCL 40 MG PO TABS: 40.0000 mg | ORAL_TABLET | Freq: Two times a day (BID) | ORAL | 3 refills | Status: DC

## 2020-04-01 MED ORDER — LAMOTRIGINE 200 MG PO TABS: 200.0000 mg | ORAL_TABLET | Freq: Two times a day (BID) | ORAL | 3 refills | Status: DC
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
## 2020-04-18 DIAGNOSIS — N9489 Other specified conditions associated with female genital organs and menstrual cycle: Secondary | ICD-10-CM | POA: Diagnosis not present

## 2020-04-18 DIAGNOSIS — Z09 Encounter for follow-up examination after completed treatment for conditions other than malignant neoplasm: Secondary | ICD-10-CM | POA: Diagnosis not present

## 2020-04-18 DIAGNOSIS — N3010 Interstitial cystitis (chronic) without hematuria: Secondary | ICD-10-CM | POA: Diagnosis not present

## 2020-05-09 DIAGNOSIS — F411 Generalized anxiety disorder: Secondary | ICD-10-CM | POA: Diagnosis not present

## 2020-05-09 DIAGNOSIS — F321 Major depressive disorder, single episode, moderate: Secondary | ICD-10-CM | POA: Diagnosis not present

## 2020-06-07 DIAGNOSIS — S93401A Sprain of unspecified ligament of right ankle, initial encounter: Secondary | ICD-10-CM | POA: Diagnosis not present

## 2020-06-21 DIAGNOSIS — Z3042 Encounter for surveillance of injectable contraceptive: Secondary | ICD-10-CM | POA: Diagnosis not present

## 2020-08-29 DIAGNOSIS — R350 Frequency of micturition: Secondary | ICD-10-CM | POA: Diagnosis not present

## 2020-08-29 DIAGNOSIS — N9489 Other specified conditions associated with female genital organs and menstrual cycle: Secondary | ICD-10-CM | POA: Diagnosis not present

## 2020-08-29 DIAGNOSIS — G479 Sleep disorder, unspecified: Secondary | ICD-10-CM | POA: Diagnosis not present

## 2020-08-29 DIAGNOSIS — R3915 Urgency of urination: Secondary | ICD-10-CM | POA: Diagnosis not present

## 2020-08-29 DIAGNOSIS — F419 Anxiety disorder, unspecified: Secondary | ICD-10-CM | POA: Diagnosis not present

## 2020-08-29 DIAGNOSIS — R110 Nausea: Secondary | ICD-10-CM | POA: Diagnosis not present

## 2020-08-29 DIAGNOSIS — Z79891 Long term (current) use of opiate analgesic: Secondary | ICD-10-CM | POA: Diagnosis not present

## 2020-08-29 DIAGNOSIS — N3010 Interstitial cystitis (chronic) without hematuria: Secondary | ICD-10-CM | POA: Diagnosis not present

## 2020-09-04 ENCOUNTER — Other Ambulatory Visit: Payer: Self-pay

## 2020-09-04 ENCOUNTER — Emergency Department (HOSPITAL_BASED_OUTPATIENT_CLINIC_OR_DEPARTMENT_OTHER)
Admission: EM | Admit: 2020-09-04 | Discharge: 2020-09-04 | Disposition: A | Discharge status: Home / Self Care | Payer: BC Managed Care – PPO | Attending: Emergency Medicine | Admitting: Emergency Medicine

## 2020-09-04 ENCOUNTER — Encounter (HOSPITAL_BASED_OUTPATIENT_CLINIC_OR_DEPARTMENT_OTHER): Payer: Self-pay

## 2020-09-04 DIAGNOSIS — S2002XA Contusion of left breast, initial encounter: Secondary | ICD-10-CM | POA: Insufficient documentation

## 2020-09-04 DIAGNOSIS — F17210 Nicotine dependence, cigarettes, uncomplicated: Secondary | ICD-10-CM | POA: Insufficient documentation

## 2020-09-04 DIAGNOSIS — S0083XA Contusion of other part of head, initial encounter: Secondary | ICD-10-CM | POA: Diagnosis not present

## 2020-09-04 DIAGNOSIS — S0993XA Unspecified injury of face, initial encounter: Secondary | ICD-10-CM | POA: Diagnosis not present

## 2020-09-04 DIAGNOSIS — M542 Cervicalgia: Secondary | ICD-10-CM | POA: Diagnosis not present

## 2020-09-04 DIAGNOSIS — S1093XA Contusion of unspecified part of neck, initial encounter: Secondary | ICD-10-CM | POA: Insufficient documentation

## 2020-09-04 DIAGNOSIS — R0789 Other chest pain: Secondary | ICD-10-CM | POA: Insufficient documentation

## 2020-09-04 DIAGNOSIS — Z91040 Latex allergy status: Secondary | ICD-10-CM | POA: Insufficient documentation

## 2020-09-04 DIAGNOSIS — Y92410 Unspecified street and highway as the place of occurrence of the external cause: Secondary | ICD-10-CM | POA: Diagnosis not present

## 2020-09-04 MED ORDER — METHOCARBAMOL 500 MG PO TABS: 500.0000 mg | ORAL_TABLET | Freq: Two times a day (BID) | ORAL | 0 refills | Status: AC

## 2020-09-04 MED ORDER — KETOROLAC TROMETHAMINE 60 MG/2ML IM SOLN
60.0000 mg | Freq: Once | INTRAMUSCULAR | Status: AC
  Administered 2020-09-04: 60 mg via INTRAMUSCULAR
  Filled 2020-09-04: qty 2
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
## 2020-09-09 ENCOUNTER — Other Ambulatory Visit: Payer: Self-pay | Admitting: Neurology

## 2020-09-12 ENCOUNTER — Other Ambulatory Visit: Payer: Self-pay

## 2020-09-12 ENCOUNTER — Ambulatory Visit (INDEPENDENT_AMBULATORY_CARE_PROVIDER_SITE_OTHER): Payer: BC Managed Care – PPO

## 2020-09-12 ENCOUNTER — Telehealth: Payer: Self-pay | Admitting: Neurology

## 2020-09-12 ENCOUNTER — Encounter: Payer: Self-pay | Admitting: Neurology

## 2020-09-12 ENCOUNTER — Other Ambulatory Visit (INDEPENDENT_AMBULATORY_CARE_PROVIDER_SITE_OTHER): Payer: BC Managed Care – PPO

## 2020-09-12 DIAGNOSIS — G43009 Migraine without aura, not intractable, without status migrainosus: Secondary | ICD-10-CM | POA: Diagnosis not present

## 2020-09-12 DIAGNOSIS — G43709 Chronic migraine without aura, not intractable, without status migrainosus: Secondary | ICD-10-CM

## 2020-09-12 DIAGNOSIS — Z3042 Encounter for surveillance of injectable contraceptive: Secondary | ICD-10-CM | POA: Diagnosis not present

## 2020-09-12 MED ORDER — KETOROLAC TROMETHAMINE 60 MG/2ML IM SOLN
60.0000 mg | Freq: Once | INTRAMUSCULAR | Status: AC
Start: 2020-09-12 — End: 2020-09-12
  Administered 2020-09-12: 60 mg via INTRAMUSCULAR

## 2020-09-12 MED ORDER — METOCLOPRAMIDE HCL 5 MG/ML IJ SOLN
10.0000 mg | Freq: Once | INTRAMUSCULAR | Status: AC
  Administered 2020-09-12: 10 mg via INTRAMUSCULAR

## 2020-09-12 MED ORDER — DIPHENHYDRAMINE HCL 50 MG/ML IJ SOLN
25.0000 mg | Freq: Once | INTRAMUSCULAR | Status: AC
  Administered 2020-09-12: 25 mg via INTRAMUSCULAR
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
## 2020-09-12 NOTE — Telephone Encounter (Signed)
Pt called and informed Ok to go to chiropractor

## 2020-09-12 NOTE — Telephone Encounter (Signed)
Ok to go to [facility], thanks
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
## 2020-09-12 NOTE — Telephone Encounter (Signed)
Pls check if she had a seizure. She has migraines, can offer migraine cocktail, thanks
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
## 2020-10-10 DIAGNOSIS — F411 Generalized anxiety disorder: Secondary | ICD-10-CM | POA: Diagnosis not present

## 2020-10-15 DIAGNOSIS — M5412 Radiculopathy, cervical region: Secondary | ICD-10-CM | POA: Diagnosis not present

## 2020-10-15 DIAGNOSIS — M542 Cervicalgia: Secondary | ICD-10-CM | POA: Diagnosis not present

## 2020-10-15 DIAGNOSIS — S335XXA Sprain of ligaments of lumbar spine, initial encounter: Secondary | ICD-10-CM | POA: Diagnosis not present

## 2020-10-15 DIAGNOSIS — M461 Sacroiliitis, not elsewhere classified: Secondary | ICD-10-CM | POA: Diagnosis not present

## 2020-11-12 ENCOUNTER — Ambulatory Visit: Payer: BC Managed Care – PPO | Admitting: Neurology

## 2020-11-12 DIAGNOSIS — F411 Generalized anxiety disorder: Secondary | ICD-10-CM | POA: Diagnosis not present

## 2020-11-12 DIAGNOSIS — S335XXA Sprain of ligaments of lumbar spine, initial encounter: Secondary | ICD-10-CM | POA: Diagnosis not present

## 2020-11-12 DIAGNOSIS — M461 Sacroiliitis, not elsewhere classified: Secondary | ICD-10-CM | POA: Diagnosis not present

## 2020-11-12 DIAGNOSIS — M5412 Radiculopathy, cervical region: Secondary | ICD-10-CM | POA: Diagnosis not present

## 2020-11-12 DIAGNOSIS — M542 Cervicalgia: Secondary | ICD-10-CM | POA: Diagnosis not present

## 2020-11-21 DIAGNOSIS — N9489 Other specified conditions associated with female genital organs and menstrual cycle: Secondary | ICD-10-CM | POA: Diagnosis not present

## 2020-11-21 DIAGNOSIS — D692 Other nonthrombocytopenic purpura: Secondary | ICD-10-CM | POA: Diagnosis not present

## 2020-11-21 DIAGNOSIS — G43709 Chronic migraine without aura, not intractable, without status migrainosus: Secondary | ICD-10-CM | POA: Diagnosis not present

## 2020-11-21 DIAGNOSIS — R109 Unspecified abdominal pain: Secondary | ICD-10-CM | POA: Diagnosis not present

## 2020-11-21 DIAGNOSIS — R350 Frequency of micturition: Secondary | ICD-10-CM | POA: Diagnosis not present

## 2020-11-21 DIAGNOSIS — R030 Elevated blood-pressure reading, without diagnosis of hypertension: Secondary | ICD-10-CM | POA: Diagnosis not present

## 2020-11-21 DIAGNOSIS — F4310 Post-traumatic stress disorder, unspecified: Secondary | ICD-10-CM | POA: Diagnosis not present

## 2020-11-21 DIAGNOSIS — N3010 Interstitial cystitis (chronic) without hematuria: Secondary | ICD-10-CM | POA: Diagnosis not present

## 2020-11-21 DIAGNOSIS — G2581 Restless legs syndrome: Secondary | ICD-10-CM | POA: Diagnosis not present

## 2020-11-21 DIAGNOSIS — L52 Erythema nodosum: Secondary | ICD-10-CM | POA: Diagnosis not present

## 2020-11-21 DIAGNOSIS — R3915 Urgency of urination: Secondary | ICD-10-CM | POA: Diagnosis not present

## 2020-11-21 DIAGNOSIS — F445 Conversion disorder with seizures or convulsions: Secondary | ICD-10-CM | POA: Diagnosis not present

## 2020-11-21 DIAGNOSIS — F17210 Nicotine dependence, cigarettes, uncomplicated: Secondary | ICD-10-CM | POA: Diagnosis not present

## 2020-11-21 DIAGNOSIS — Z9682 Presence of neurostimulator: Secondary | ICD-10-CM | POA: Diagnosis not present

## 2020-11-21 DIAGNOSIS — F411 Generalized anxiety disorder: Secondary | ICD-10-CM | POA: Diagnosis not present

## 2020-11-21 DIAGNOSIS — F32A Depression, unspecified: Secondary | ICD-10-CM | POA: Diagnosis not present

## 2020-11-21 DIAGNOSIS — R300 Dysuria: Secondary | ICD-10-CM | POA: Diagnosis not present

## 2020-11-26 DIAGNOSIS — F411 Generalized anxiety disorder: Secondary | ICD-10-CM | POA: Diagnosis not present

## 2020-11-29 DIAGNOSIS — M542 Cervicalgia: Secondary | ICD-10-CM | POA: Diagnosis not present

## 2020-11-29 DIAGNOSIS — M545 Low back pain, unspecified: Secondary | ICD-10-CM | POA: Diagnosis not present

## 2020-12-03 DIAGNOSIS — R350 Frequency of micturition: Secondary | ICD-10-CM | POA: Diagnosis not present

## 2020-12-03 DIAGNOSIS — Z8619 Personal history of other infectious and parasitic diseases: Secondary | ICD-10-CM | POA: Diagnosis not present

## 2020-12-03 DIAGNOSIS — N3010 Interstitial cystitis (chronic) without hematuria: Secondary | ICD-10-CM | POA: Diagnosis not present

## 2020-12-03 DIAGNOSIS — R3915 Urgency of urination: Secondary | ICD-10-CM | POA: Diagnosis not present

## 2020-12-03 DIAGNOSIS — T8149XA Infection following a procedure, other surgical site, initial encounter: Secondary | ICD-10-CM | POA: Diagnosis not present

## 2020-12-03 DIAGNOSIS — N9489 Other specified conditions associated with female genital organs and menstrual cycle: Secondary | ICD-10-CM | POA: Diagnosis not present

## 2020-12-03 DIAGNOSIS — Z09 Encounter for follow-up examination after completed treatment for conditions other than malignant neoplasm: Secondary | ICD-10-CM | POA: Diagnosis not present

## 2020-12-05 DIAGNOSIS — T8141XA Infection following a procedure, superficial incisional surgical site, initial encounter: Secondary | ICD-10-CM | POA: Diagnosis not present

## 2020-12-05 DIAGNOSIS — Z4542 Encounter for adjustment and management of neuropacemaker (brain) (peripheral nerve) (spinal cord): Secondary | ICD-10-CM | POA: Diagnosis not present

## 2020-12-05 DIAGNOSIS — Y828 Other medical devices associated with adverse incidents: Secondary | ICD-10-CM | POA: Diagnosis not present

## 2020-12-05 DIAGNOSIS — T83590A Infection and inflammatory reaction due to implanted urinary neurostimulation device, initial encounter: Secondary | ICD-10-CM | POA: Diagnosis not present

## 2020-12-05 DIAGNOSIS — T85734A Infection and inflammatory reaction due to implanted electronic neurostimulator, generator, initial encounter: Secondary | ICD-10-CM | POA: Diagnosis not present

## 2020-12-10 DIAGNOSIS — Z3042 Encounter for surveillance of injectable contraceptive: Secondary | ICD-10-CM | POA: Diagnosis not present

## 2020-12-24 DIAGNOSIS — F411 Generalized anxiety disorder: Secondary | ICD-10-CM | POA: Diagnosis not present

## 2021-01-14 DIAGNOSIS — F411 Generalized anxiety disorder: Secondary | ICD-10-CM | POA: Diagnosis not present

## 2021-01-20 DIAGNOSIS — M545 Low back pain, unspecified: Secondary | ICD-10-CM | POA: Diagnosis not present

## 2021-01-20 DIAGNOSIS — M542 Cervicalgia: Secondary | ICD-10-CM | POA: Diagnosis not present

## 2021-01-21 DIAGNOSIS — M542 Cervicalgia: Secondary | ICD-10-CM | POA: Diagnosis not present

## 2021-01-21 DIAGNOSIS — M545 Low back pain, unspecified: Secondary | ICD-10-CM | POA: Diagnosis not present

## 2021-01-31 DIAGNOSIS — Z716 Tobacco abuse counseling: Secondary | ICD-10-CM | POA: Diagnosis not present

## 2021-01-31 DIAGNOSIS — M47812 Spondylosis without myelopathy or radiculopathy, cervical region: Secondary | ICD-10-CM | POA: Diagnosis not present

## 2021-01-31 DIAGNOSIS — F17210 Nicotine dependence, cigarettes, uncomplicated: Secondary | ICD-10-CM | POA: Diagnosis not present

## 2021-01-31 DIAGNOSIS — M47816 Spondylosis without myelopathy or radiculopathy, lumbar region: Secondary | ICD-10-CM | POA: Diagnosis not present

## 2021-02-05 DIAGNOSIS — M542 Cervicalgia: Secondary | ICD-10-CM | POA: Diagnosis not present

## 2021-02-05 DIAGNOSIS — M545 Low back pain, unspecified: Secondary | ICD-10-CM | POA: Diagnosis not present

## 2021-02-11 DIAGNOSIS — M545 Low back pain, unspecified: Secondary | ICD-10-CM | POA: Diagnosis not present

## 2021-02-11 DIAGNOSIS — M542 Cervicalgia: Secondary | ICD-10-CM | POA: Diagnosis not present

## 2021-02-14 DIAGNOSIS — M542 Cervicalgia: Secondary | ICD-10-CM | POA: Diagnosis not present

## 2021-02-14 DIAGNOSIS — M545 Low back pain, unspecified: Secondary | ICD-10-CM | POA: Diagnosis not present

## 2021-02-17 DIAGNOSIS — J209 Acute bronchitis, unspecified: Secondary | ICD-10-CM | POA: Diagnosis not present

## 2021-02-17 DIAGNOSIS — Z1152 Encounter for screening for COVID-19: Secondary | ICD-10-CM | POA: Diagnosis not present

## 2021-02-17 DIAGNOSIS — Z20822 Contact with and (suspected) exposure to covid-19: Secondary | ICD-10-CM | POA: Diagnosis not present

## 2021-02-18 DIAGNOSIS — M542 Cervicalgia: Secondary | ICD-10-CM | POA: Diagnosis not present

## 2021-02-18 DIAGNOSIS — M545 Low back pain, unspecified: Secondary | ICD-10-CM | POA: Diagnosis not present

## 2021-02-20 DIAGNOSIS — M545 Low back pain, unspecified: Secondary | ICD-10-CM | POA: Diagnosis not present

## 2021-02-20 DIAGNOSIS — M542 Cervicalgia: Secondary | ICD-10-CM | POA: Diagnosis not present

## 2021-02-25 DIAGNOSIS — Z3202 Encounter for pregnancy test, result negative: Secondary | ICD-10-CM | POA: Diagnosis not present

## 2021-02-25 DIAGNOSIS — Z3042 Encounter for surveillance of injectable contraceptive: Secondary | ICD-10-CM | POA: Diagnosis not present

## 2021-02-25 DIAGNOSIS — M47816 Spondylosis without myelopathy or radiculopathy, lumbar region: Secondary | ICD-10-CM | POA: Diagnosis not present

## 2021-02-25 DIAGNOSIS — M545 Low back pain, unspecified: Secondary | ICD-10-CM | POA: Diagnosis not present

## 2021-02-25 DIAGNOSIS — M542 Cervicalgia: Secondary | ICD-10-CM | POA: Diagnosis not present

## 2021-03-04 DIAGNOSIS — M545 Low back pain, unspecified: Secondary | ICD-10-CM | POA: Diagnosis not present

## 2021-03-04 DIAGNOSIS — M542 Cervicalgia: Secondary | ICD-10-CM | POA: Diagnosis not present

## 2021-03-06 DIAGNOSIS — M545 Low back pain, unspecified: Secondary | ICD-10-CM | POA: Diagnosis not present

## 2021-03-06 DIAGNOSIS — M542 Cervicalgia: Secondary | ICD-10-CM | POA: Diagnosis not present

## 2021-03-07 DIAGNOSIS — R350 Frequency of micturition: Secondary | ICD-10-CM | POA: Diagnosis not present

## 2021-03-07 DIAGNOSIS — N3010 Interstitial cystitis (chronic) without hematuria: Secondary | ICD-10-CM | POA: Diagnosis not present

## 2021-03-07 DIAGNOSIS — R3915 Urgency of urination: Secondary | ICD-10-CM | POA: Diagnosis not present

## 2021-03-07 DIAGNOSIS — E668 Other obesity: Secondary | ICD-10-CM | POA: Diagnosis not present

## 2021-03-07 DIAGNOSIS — R569 Unspecified convulsions: Secondary | ICD-10-CM | POA: Diagnosis not present

## 2021-03-07 DIAGNOSIS — N8184 Pelvic muscle wasting: Secondary | ICD-10-CM | POA: Diagnosis not present

## 2021-03-07 DIAGNOSIS — M7918 Myalgia, other site: Secondary | ICD-10-CM | POA: Diagnosis not present

## 2021-03-07 DIAGNOSIS — F17210 Nicotine dependence, cigarettes, uncomplicated: Secondary | ICD-10-CM | POA: Diagnosis not present

## 2021-03-07 DIAGNOSIS — Z6831 Body mass index (BMI) 31.0-31.9, adult: Secondary | ICD-10-CM | POA: Diagnosis not present

## 2021-03-20 DIAGNOSIS — M47812 Spondylosis without myelopathy or radiculopathy, cervical region: Secondary | ICD-10-CM | POA: Diagnosis not present

## 2021-04-14 DIAGNOSIS — M47816 Spondylosis without myelopathy or radiculopathy, lumbar region: Secondary | ICD-10-CM | POA: Diagnosis not present

## 2021-04-29 DIAGNOSIS — S335XXD Sprain of ligaments of lumbar spine, subsequent encounter: Secondary | ICD-10-CM | POA: Diagnosis not present

## 2021-04-29 DIAGNOSIS — S134XXD Sprain of ligaments of cervical spine, subsequent encounter: Secondary | ICD-10-CM | POA: Diagnosis not present

## 2021-05-01 DIAGNOSIS — M47816 Spondylosis without myelopathy or radiculopathy, lumbar region: Secondary | ICD-10-CM | POA: Diagnosis not present

## 2021-05-21 DIAGNOSIS — Z3042 Encounter for surveillance of injectable contraceptive: Secondary | ICD-10-CM | POA: Diagnosis not present

## 2021-05-21 DIAGNOSIS — S134XXD Sprain of ligaments of cervical spine, subsequent encounter: Secondary | ICD-10-CM | POA: Diagnosis not present

## 2021-05-21 DIAGNOSIS — S335XXD Sprain of ligaments of lumbar spine, subsequent encounter: Secondary | ICD-10-CM | POA: Diagnosis not present

## 2021-05-22 DIAGNOSIS — M47816 Spondylosis without myelopathy or radiculopathy, lumbar region: Secondary | ICD-10-CM | POA: Diagnosis not present

## 2021-06-02 DIAGNOSIS — Z716 Tobacco abuse counseling: Secondary | ICD-10-CM | POA: Diagnosis not present

## 2021-06-02 DIAGNOSIS — F17210 Nicotine dependence, cigarettes, uncomplicated: Secondary | ICD-10-CM | POA: Diagnosis not present

## 2021-06-02 DIAGNOSIS — M47816 Spondylosis without myelopathy or radiculopathy, lumbar region: Secondary | ICD-10-CM | POA: Diagnosis not present

## 2021-06-12 ENCOUNTER — Other Ambulatory Visit: Payer: Self-pay | Admitting: Neurology

## 2021-06-13 ENCOUNTER — Other Ambulatory Visit: Payer: Self-pay | Admitting: Neurology

## 2021-06-13 DIAGNOSIS — G40309 Generalized idiopathic epilepsy and epileptic syndromes, not intractable, without status epilepticus: Secondary | ICD-10-CM

## 2021-06-20 ENCOUNTER — Other Ambulatory Visit: Payer: Self-pay | Admitting: Neurology

## 2021-06-20 ENCOUNTER — Telehealth: Payer: Self-pay | Admitting: Neurology

## 2021-06-20 DIAGNOSIS — G40309 Generalized idiopathic epilepsy and epileptic syndromes, not intractable, without status epilepticus: Secondary | ICD-10-CM

## 2021-06-20 NOTE — Telephone Encounter (Signed)
1. Which medications need refilled? (List name and dosage, if known) lamictal to get her to her next visit on 11/20/21. She only has tonight's dose left  2. Which pharmacy/location is medication to be sent to? (include street and city if local pharmacy) [facility] [address]

## 2021-06-20 NOTE — Telephone Encounter (Signed)
Refill sent for pt

## 2021-07-24 DIAGNOSIS — M47816 Spondylosis without myelopathy or radiculopathy, lumbar region: Secondary | ICD-10-CM | POA: Diagnosis not present

## 2021-08-12 DIAGNOSIS — M47812 Spondylosis without myelopathy or radiculopathy, cervical region: Secondary | ICD-10-CM | POA: Diagnosis not present

## 2021-08-26 DIAGNOSIS — Z3202 Encounter for pregnancy test, result negative: Secondary | ICD-10-CM | POA: Diagnosis not present

## 2021-08-26 DIAGNOSIS — Z3042 Encounter for surveillance of injectable contraceptive: Secondary | ICD-10-CM | POA: Diagnosis not present

## 2021-09-03 DIAGNOSIS — M47812 Spondylosis without myelopathy or radiculopathy, cervical region: Secondary | ICD-10-CM | POA: Diagnosis not present

## 2021-09-22 DIAGNOSIS — M47812 Spondylosis without myelopathy or radiculopathy, cervical region: Secondary | ICD-10-CM | POA: Diagnosis not present

## 2021-10-16 DIAGNOSIS — M47812 Spondylosis without myelopathy or radiculopathy, cervical region: Secondary | ICD-10-CM | POA: Diagnosis not present

## 2021-11-05 DIAGNOSIS — M47812 Spondylosis without myelopathy or radiculopathy, cervical region: Secondary | ICD-10-CM | POA: Diagnosis not present

## 2021-11-18 DIAGNOSIS — Z3042 Encounter for surveillance of injectable contraceptive: Secondary | ICD-10-CM | POA: Diagnosis not present

## 2021-11-20 ENCOUNTER — Ambulatory Visit (INDEPENDENT_AMBULATORY_CARE_PROVIDER_SITE_OTHER): Payer: BC Managed Care – PPO | Admitting: Neurology

## 2021-11-20 ENCOUNTER — Other Ambulatory Visit (INDEPENDENT_AMBULATORY_CARE_PROVIDER_SITE_OTHER): Payer: BC Managed Care – PPO

## 2021-11-20 ENCOUNTER — Encounter: Payer: Self-pay | Admitting: Neurology

## 2021-11-20 VITALS — BP 118/81 | HR 81 | Ht 62.0 in | Wt 173.0 lb

## 2021-11-20 DIAGNOSIS — G40309 Generalized idiopathic epilepsy and epileptic syndromes, not intractable, without status epilepticus: Secondary | ICD-10-CM

## 2021-11-20 DIAGNOSIS — G2581 Restless legs syndrome: Secondary | ICD-10-CM | POA: Diagnosis not present

## 2021-11-20 DIAGNOSIS — R251 Tremor, unspecified: Secondary | ICD-10-CM | POA: Diagnosis not present

## 2021-11-20 DIAGNOSIS — G43009 Migraine without aura, not intractable, without status migrainosus: Secondary | ICD-10-CM

## 2021-11-20 MED ORDER — PROPRANOLOL HCL 40 MG PO TABS: 40.0000 mg | ORAL_TABLET | Freq: Two times a day (BID) | ORAL | 3 refills | Status: DC

## 2021-11-20 MED ORDER — PRAMIPEXOLE DIHYDROCHLORIDE 0.25 MG PO TABS: ORAL_TABLET | ORAL | 3 refills | Status: DC

## 2021-11-20 MED ORDER — SUMATRIPTAN SUCCINATE 25 MG PO TABS: ORAL_TABLET | ORAL | 11 refills | Status: DC

## 2021-11-20 MED ORDER — LAMOTRIGINE 200 MG PO TABS: 200.0000 mg | ORAL_TABLET | Freq: Two times a day (BID) | ORAL | 3 refills | Status: DC
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
## 2021-11-21 LAB — IRON,TIBC AND FERRITIN PANEL
%SAT: 17 % (calc) (ref 16–45)
Ferritin: 42 ng/mL (ref 16–154)
Iron: 61 ug/dL (ref 40–190)
TIBC: 367 mcg/dL (calc) (ref 250–450)

## 2021-12-12 DIAGNOSIS — M47812 Spondylosis without myelopathy or radiculopathy, cervical region: Secondary | ICD-10-CM | POA: Diagnosis not present

## 2022-02-10 ENCOUNTER — Other Ambulatory Visit: Payer: Self-pay

## 2022-02-10 ENCOUNTER — Other Ambulatory Visit (HOSPITAL_COMMUNITY)
Admission: RE | Admit: 2022-02-10 | Discharge: 2022-02-10 | Disposition: A | Discharge status: Home / Self Care | Payer: BC Managed Care – PPO | Source: Ambulatory Visit | Attending: Nurse Practitioner | Admitting: Nurse Practitioner

## 2022-02-10 ENCOUNTER — Other Ambulatory Visit: Payer: Self-pay | Admitting: Nurse Practitioner

## 2022-02-10 DIAGNOSIS — Z01419 Encounter for gynecological examination (general) (routine) without abnormal findings: Secondary | ICD-10-CM | POA: Diagnosis not present

## 2022-02-10 DIAGNOSIS — Z3042 Encounter for surveillance of injectable contraceptive: Secondary | ICD-10-CM | POA: Diagnosis not present

## 2022-02-10 DIAGNOSIS — Z124 Encounter for screening for malignant neoplasm of cervix: Secondary | ICD-10-CM | POA: Diagnosis not present

## 2022-02-10 MED ORDER — MEDROXYPROGESTERONE ACETATE 150 MG/ML IM SUSY
PREFILLED_SYRINGE | INTRAMUSCULAR | 3 refills | Status: DC
  Filled 2022-02-10: qty 1, 90d supply, fill #0
  Filled 2022-05-08: qty 1, 90d supply, fill #1
  Filled 2022-08-13: qty 1, 90d supply, fill #2
  Filled 2022-11-02: qty 1, 90d supply, fill #3

## 2022-02-11 ENCOUNTER — Other Ambulatory Visit: Payer: Self-pay | Admitting: Nurse Practitioner

## 2022-02-11 DIAGNOSIS — Z1231 Encounter for screening mammogram for malignant neoplasm of breast: Secondary | ICD-10-CM

## 2022-02-12 LAB — CYTOLOGY - PAP
Comment: NEGATIVE
Diagnosis: NEGATIVE
High risk HPV: NEGATIVE

## 2022-02-20 ENCOUNTER — Ambulatory Visit
Admission: RE | Admit: 2022-02-20 | Discharge: 2022-02-20 | Disposition: A | Discharge status: Home / Self Care | Payer: BC Managed Care – PPO | Source: Ambulatory Visit | Attending: Nurse Practitioner | Admitting: Nurse Practitioner

## 2022-02-20 DIAGNOSIS — Z1231 Encounter for screening mammogram for malignant neoplasm of breast: Secondary | ICD-10-CM | POA: Diagnosis not present

## 2022-02-24 ENCOUNTER — Other Ambulatory Visit: Payer: Self-pay | Admitting: Nurse Practitioner

## 2022-02-24 DIAGNOSIS — R928 Other abnormal and inconclusive findings on diagnostic imaging of breast: Secondary | ICD-10-CM

## 2022-03-04 ENCOUNTER — Ambulatory Visit
Admission: RE | Admit: 2022-03-04 | Discharge: 2022-03-04 | Disposition: A | Discharge status: Home / Self Care | Payer: BC Managed Care – PPO | Source: Ambulatory Visit | Attending: Nurse Practitioner | Admitting: Nurse Practitioner

## 2022-03-04 ENCOUNTER — Other Ambulatory Visit: Payer: Self-pay | Admitting: Nurse Practitioner

## 2022-03-04 DIAGNOSIS — R928 Other abnormal and inconclusive findings on diagnostic imaging of breast: Secondary | ICD-10-CM

## 2022-03-04 DIAGNOSIS — R921 Mammographic calcification found on diagnostic imaging of breast: Secondary | ICD-10-CM | POA: Diagnosis not present

## 2022-03-04 DIAGNOSIS — N6489 Other specified disorders of breast: Secondary | ICD-10-CM

## 2022-03-16 ENCOUNTER — Ambulatory Visit
Admission: RE | Admit: 2022-03-16 | Discharge: 2022-03-16 | Disposition: A | Discharge status: Home / Self Care | Payer: BC Managed Care – PPO | Source: Ambulatory Visit | Attending: Nurse Practitioner | Admitting: Nurse Practitioner

## 2022-03-16 DIAGNOSIS — R921 Mammographic calcification found on diagnostic imaging of breast: Secondary | ICD-10-CM | POA: Diagnosis not present

## 2022-03-16 DIAGNOSIS — N6489 Other specified disorders of breast: Secondary | ICD-10-CM | POA: Diagnosis not present

## 2022-03-23 ENCOUNTER — Other Ambulatory Visit: Payer: Self-pay | Admitting: Nurse Practitioner

## 2022-03-23 ENCOUNTER — Ambulatory Visit
Admission: RE | Admit: 2022-03-23 | Discharge: 2022-03-23 | Disposition: A | Discharge status: Home / Self Care | Payer: BC Managed Care – PPO | Source: Ambulatory Visit | Attending: Nurse Practitioner | Admitting: Nurse Practitioner

## 2022-03-23 DIAGNOSIS — N6320 Unspecified lump in the left breast, unspecified quadrant: Secondary | ICD-10-CM

## 2022-03-23 DIAGNOSIS — N644 Mastodynia: Secondary | ICD-10-CM | POA: Diagnosis not present

## 2022-04-17 ENCOUNTER — Telehealth: Payer: Self-pay | Admitting: Neurology

## 2022-04-17 NOTE — Telephone Encounter (Signed)
Pt will come by and pick up a copy of note, to take with them to the office

## 2022-04-17 NOTE — Telephone Encounter (Signed)
We can send my note and highlight where I mentioned she is NOT drinking alcohol. I cannot change my note. Pls have her confirm with them what the issue is. Thanks

## 2022-04-17 NOTE — Telephone Encounter (Signed)
Pt called in stating our office sent a report to the pt's Urologist and that has affected her alprazolam.  She would like to speak with someone about this today because she has an appointment on Monday morning with her Urologist.

## 2022-04-17 NOTE — Telephone Encounter (Signed)
Pt stated that she talked to you about her drinking but she has never abused her benzos she stated they have cut her off and she would like for you to admin your note saying miscommunication so she can get her medication back at her appointment. She said if you need to you can call her back.

## 2022-04-17 NOTE — Telephone Encounter (Signed)
Not sure what the concern is, I don't Rx her alprazolam and only wrote down what she told me. Pls see what is the issue, thanks

## 2022-05-08 ENCOUNTER — Other Ambulatory Visit: Payer: Self-pay

## 2022-06-01 ENCOUNTER — Ambulatory Visit: Payer: BC Managed Care – PPO | Admitting: Neurology

## 2022-06-30 ENCOUNTER — Ambulatory Visit: Payer: BC Managed Care – PPO | Admitting: Neurology

## 2022-06-30 ENCOUNTER — Encounter: Payer: Self-pay | Admitting: Neurology

## 2022-06-30 VITALS — BP 121/77 | HR 79 | Ht 62.0 in | Wt 163.2 lb

## 2022-06-30 DIAGNOSIS — G2581 Restless legs syndrome: Secondary | ICD-10-CM | POA: Diagnosis not present

## 2022-06-30 DIAGNOSIS — R251 Tremor, unspecified: Secondary | ICD-10-CM

## 2022-06-30 DIAGNOSIS — G43009 Migraine without aura, not intractable, without status migrainosus: Secondary | ICD-10-CM | POA: Diagnosis not present

## 2022-06-30 DIAGNOSIS — G40309 Generalized idiopathic epilepsy and epileptic syndromes, not intractable, without status epilepticus: Secondary | ICD-10-CM | POA: Diagnosis not present

## 2022-06-30 MED ORDER — SUMATRIPTAN SUCCINATE 25 MG PO TABS: ORAL_TABLET | ORAL | 11 refills | Status: AC

## 2022-06-30 MED ORDER — PROPRANOLOL HCL 40 MG PO TABS: 40.0000 mg | ORAL_TABLET | Freq: Two times a day (BID) | ORAL | 3 refills | Status: DC

## 2022-06-30 MED ORDER — PRAMIPEXOLE DIHYDROCHLORIDE 0.25 MG PO TABS: ORAL_TABLET | ORAL | 3 refills | Status: AC

## 2022-06-30 MED ORDER — LAMOTRIGINE 200 MG PO TABS: 200.0000 mg | ORAL_TABLET | Freq: Two times a day (BID) | ORAL | 3 refills | Status: DC

## 2022-06-30 MED ORDER — SERTRALINE HCL 25 MG PO TABS: ORAL_TABLET | ORAL | 6 refills | Status: DC
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
## 2022-07-22 ENCOUNTER — Other Ambulatory Visit: Payer: Self-pay | Admitting: Neurology

## 2022-08-13 ENCOUNTER — Other Ambulatory Visit: Payer: Self-pay

## 2022-10-26 ENCOUNTER — Other Ambulatory Visit: Payer: Self-pay | Admitting: Neurology

## 2022-11-02 ENCOUNTER — Other Ambulatory Visit: Payer: Self-pay

## 2022-11-03 ENCOUNTER — Ambulatory Visit: Payer: BC Managed Care – PPO | Admitting: Neurology

## 2022-11-04 ENCOUNTER — Other Ambulatory Visit: Payer: Self-pay

## 2022-11-17 ENCOUNTER — Other Ambulatory Visit: Payer: Self-pay | Admitting: Neurology

## 2022-11-23 ENCOUNTER — Ambulatory Visit: Payer: BC Managed Care – PPO | Admitting: Neurology

## 2022-11-23 ENCOUNTER — Encounter: Payer: Self-pay | Admitting: Neurology

## 2022-11-23 DIAGNOSIS — Z029 Encounter for administrative examinations, unspecified: Secondary | ICD-10-CM

## 2022-12-08 ENCOUNTER — Ambulatory Visit: Payer: BC Managed Care – PPO | Admitting: Neurology

## 2022-12-31 ENCOUNTER — Ambulatory Visit
Admission: EM | Admit: 2022-12-31 | Discharge: 2022-12-31 | Disposition: A | Discharge status: Home / Self Care | Payer: BC Managed Care – PPO | Attending: Nurse Practitioner | Admitting: Nurse Practitioner

## 2022-12-31 DIAGNOSIS — S39012A Strain of muscle, fascia and tendon of lower back, initial encounter: Secondary | ICD-10-CM

## 2022-12-31 MED ORDER — LIDOCAINE 5 % EX PTCH: 1.0000 | MEDICATED_PATCH | CUTANEOUS | 0 refills | Status: AC

## 2022-12-31 MED ORDER — NAPROXEN 500 MG PO TABS: 500.0000 mg | ORAL_TABLET | Freq: Two times a day (BID) | ORAL | 0 refills | Status: AC

## 2022-12-31 NOTE — Discharge Instructions (Signed)
Start naproxen twice a day for 7 days. Apply Lidoderm patch to your left lower back for 12 hours and remove for 12 hours.  You may apply heat to the back but not while you have the Lidoderm patch in place.  Please follow-up with your PCP if your symptoms do not improve.  Please go to the ER for any worsening symptoms.  Hope you feel better soon!

## 2022-12-31 NOTE — ED Provider Notes (Signed)
[facility]    CSN: [id number] Arrival date & time: 12/31/22  1611      History   Chief Complaint Chief Complaint  Patient presents with   Back Pain    Burning in urination    HPI Stacy Mejia is a 42 y.o. female presents for back pain.  Patient reports 2 days of a left lower back pain that is aching type pain that does not radiate.  It is intermittent.  Denies any numbness/tingling/weakness of her lower extremities and no saddle paresthesia.  She does have a history of IC and pelvic floor dysfunction and does have to self catheterize denies any change in this.  She does report some dark urine recently and burning when she casts but denies any hematuria, fevers, nausea/vomiting.  She does follow with urology.  She is concerned she might have a kidney infection.  She took some Aleve over-the-counter which somewhat helped her symptoms.  No other concerns at this time.   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   Long term (current) use of opiate analgesic 07/10/2014   Depression 07/10/2014   Drug treatment not indicated 07/10/2014   Chronic use of opiate for therapeutic purpose 07/10/2014   Urinary urgency 07/10/2014   Posttraumatic stress disorder 06/15/2014   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   Chronic interstitial cystitis 08/12/2011   Dysuria 08/12/2011   Chronic migraine without aura 05/25/2011   Disorder of female genital organs 05/25/2011   Increased frequency of urination 05/25/2011   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/2011   Status post laparoscopic cholecystectomy  09/19/2010   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   OTHER SPEC DISEASES BLOOD&BLOOD-FORMING ORGANS 09/11/2009   Generalized anxiety disorder 09/11/2009   Tobacco user 09/11/2009   Erythema nodosum 09/11/2009   DIZZINESS 09/11/2009   PERSONAL HX OTH INFECTIOUS&PARASITIC DISEASE 09/11/2009   History of risk factor for suicide 09/11/2009    Past Surgical History:  Procedure Laterality Date   bladder stimulater     BLADDER SURGERY     CHOLECYSTECTOMY     I & D EXTREMITY Right 01/22/2018   Procedure: IRRIGATION AND DEBRIDEMENT OF FOREARM;  Surgeon: Dominica Severin, MD;  Location: [facility] OR;  Service: Orthopedics;  Laterality: Right;   I & D EXTREMITY Right 01/24/2018   Procedure: REPEAT IRRIGATION AND DEBRIDEMENT RIGHT FOREARM;  Surgeon: Dominica Severin, MD;  Location: [facility] OR;  Service: Orthopedics;  Laterality: Right;   Removal of stimulater from badder     TONSILLECTOMY      OB History   No obstetric history on file.      Home Medications    Prior to Admission medications   Medication Sig Start Date End Date Taking? Authorizing Provider  ALPRAZolam Prudy Feeler) 1 MG tablet Take 1 mg by mouth 2 (two) times daily as needed for anxiety. 08/22/15  Yes [provider]  aspirin-acetaminophen-caffeine (EXCEDRIN MIGRAINE) [phone] MG tablet Take 2 tablets by mouth every 6 (six) hours as needed for headache.   Yes [provider]  fexofenadine (ALLEGRA) 60 MG  tablet Take 60 mg by mouth daily as needed for allergies.    Yes [provider]  lamoTRIgine (LAMICTAL) 200 MG tablet Take 1 tablet (200 mg total) by mouth 2 (two) times daily. 06/30/22  Yes Van Clines, MD  lidocaine (LIDODERM) 5 % Place 1 patch onto the skin daily. Remove & Discard patch within 12 hours or as directed by MD 12/31/22  Yes Radford Pax, NP  methocarbamol (ROBAXIN) 500 MG tablet Take 1 tablet (500 mg total) by mouth 2 (two) times daily. 09/04/20  Yes Mare Ferrari, PA-C  naproxen  (NAPROSYN) 500 MG tablet Take 1 tablet (500 mg total) by mouth 2 (two) times daily for 7 days. 12/31/22 01/07/23 Yes Radford Pax, NP  pentosan polysulfate (ELMIRON) 100 MG capsule Take 100 mg by mouth daily as needed (bladder). 08/28/15  Yes [provider]  pramipexole (MIRAPEX) 0.25 MG tablet Take 2 tablets 2 hours prior to bedtime 06/30/22  Yes Van Clines, MD  propranolol (INDERAL) 40 MG tablet Take 1 tablet (40 mg total) by mouth 2 (two) times daily. 06/30/22  Yes Van Clines, MD  sertraline (ZOLOFT) 25 MG tablet TAKE 1 TABLET DAILY 11/17/22  Yes Van Clines, MD  traZODone (DESYREL) 50 MG tablet Take 50 mg by mouth at bedtime.  10/14/17  Yes [provider]  medroxyPROGESTERone (DEPO-PROVERA) 150 MG/ML injection Inject 150 mg into the muscle every 3 (three) months.     [provider]  medroxyPROGESTERone Acetate 150 MG/ML SUSY Inject into the muscle every 90 days. 02/10/22     SUMAtriptan (IMITREX) 25 MG tablet Take 1 tablet at onset of migraine. Do not take more than 2-3 a week 06/30/22   Van Clines, MD    Family History Family History  Problem Relation Age of Onset   Hyperlipidemia Mother    Hypertension Mother    Hyperlipidemia Father    Hypertension Father    Diabetes Father    Heart disease Father    Depression Father    Seizures Brother 34       drug and alocohol use   Alcohol abuse Brother    Cancer Maternal Grandmother        colon   Cancer Paternal Grandmother        colon    Social History Social History   Tobacco Use   Smoking status: Every Day    Packs/day: 1    Types: Cigarettes   Smokeless tobacco: Never  Vaping Use   Vaping Use: Never used  Substance Use Topics   Alcohol use: No    Alcohol/week: 0.0 standard drinks of alcohol   Drug use: No    Comment: "pills and heroin" previously     Allergies   Tequin [gatifloxacin], Adhesive [tape], and Latex   Review of Systems Review of Systems  Genitourinary:   Positive for dysuria.  Musculoskeletal:  Positive for back pain.     Physical Exam Triage Vital Signs ED Triage Vitals  Enc Vitals Group     BP 12/31/22 1624 101/60     Pulse Rate 12/31/22 1624 86     Resp 12/31/22 1624 19     Temp 12/31/22 1624 98.1 F (36.7 C)     Temp Source 12/31/22 1624 Oral     SpO2 12/31/22 1624 92 %     Weight 12/31/22 1624 148 lb (67.1 kg)     Height 12/31/22 1624 5\' 2"  (1.575 m)  Head Circumference --      Peak Flow --      Pain Score 12/31/22 1623 9     Pain Loc --      Pain Edu? --      Excl. in GC? --    No data found.  Updated Vital Signs BP 101/60 (BP Location: Left Arm)   Pulse 86   Temp 98.1 F (36.7 C) (Oral)   Resp 19   Ht 5\' 2"  (1.575 m)   Wt 148 lb (67.1 kg)   SpO2 92%   BMI 27.07 kg/m   Visual Acuity Right Eye Distance:   Left Eye Distance:   Bilateral Distance:    Right Eye Near:   Left Eye Near:    Bilateral Near:     Physical Exam Vitals and nursing note reviewed.  Constitutional:      Appearance: Normal appearance.  HENT:     Head: Normocephalic and atraumatic.  Eyes:     Pupils: Pupils are equal, round, and reactive to light.  Cardiovascular:     Rate and Rhythm: Normal rate.  Pulmonary:     Effort: Pulmonary effort is normal.  Abdominal:     Tenderness: There is no right CVA tenderness or left CVA tenderness.  Musculoskeletal:     Lumbar back: Spasms and tenderness present. No swelling, edema, deformity, signs of trauma, lacerations or bony tenderness. Normal range of motion. Negative right straight leg raise test and negative left straight leg raise test. No scoliosis.       Back:     Comments: Strength 5 out of 5 bilateral lower extremities  Skin:    General: Skin is warm and dry.  Neurological:     General: No focal deficit present.     Mental Status: She is alert and oriented to person, place, and time.  Psychiatric:        Mood and Affect: Mood normal.        Behavior: Behavior normal.       UC Treatments / Results  Labs (all labs ordered are listed, but only abnormal results are displayed) Labs Reviewed - No data to display   EKG   Radiology No results found.  Procedures Procedures (including critical care time)  Medications Ordered in UC Medications - No data to display  Initial Impression / Assessment and Plan / UC Course  I have reviewed the triage vital signs and the nursing notes.  Pertinent labs & imaging results that were available during my care of the patient were reviewed by me and considered in my medical decision making (see chart for details).     Reviewed exam and symptoms with patient.  No red flags. Consistent with a low back strain.  Trial of naproxen and Lidoderm patch.  Discussed with patient concern for urinary symptoms.  She is unable to leave a sample as she has a self cath and did not bring her supplies with her.  She does report she has appointment with urologist on Monday.  Advise if she would like to bring a sample back today to be evaluated she can do so otherwise she can follow-up with urologist on Monday.  ER precautions reviewed and she verbalized understanding Final Clinical Impressions(s) / UC Diagnoses   Final diagnoses:  Strain of lumbar region, initial encounter     Discharge Instructions      Start naproxen twice a day for 7 days. Apply Lidoderm patch to your left lower back for 12 hours  and remove for 12 hours.  You may apply heat to the back but not while you have the Lidoderm patch in place.  Please follow-up with your PCP if your symptoms do not improve.  Please go to the ER for any worsening symptoms.  Hope you feel better soon!    ED Prescriptions     Medication Sig Dispense Auth. Provider   naproxen (NAPROSYN) 500 MG tablet Take 1 tablet (500 mg total) by mouth 2 (two) times daily for 7 days. 14 tablet Radford Pax, NP   lidocaine (LIDODERM) 5 % Place 1 patch onto the skin daily. Remove & Discard patch  within 12 hours or as directed by MD 10 patch Radford Pax, NP      PDMP not reviewed this encounter.   Radford Pax, NP 12/31/22 1650

## 2022-12-31 NOTE — ED Triage Notes (Signed)
Patient here today with c/o left LB pain and dark urine X 2 days. She has also been having burning in urination and seems to be worsening. She has Interstitial cystitis and has to self catheter. She does not have one with her.

## 2023-01-25 ENCOUNTER — Other Ambulatory Visit: Payer: Self-pay

## 2023-01-25 MED ORDER — MEDROXYPROGESTERONE ACETATE 150 MG/ML IM SUSY
PREFILLED_SYRINGE | INTRAMUSCULAR | 0 refills | Status: AC
  Filled 2023-01-25: qty 1, 90d supply, fill #0

## 2023-02-09 ENCOUNTER — Other Ambulatory Visit: Payer: Self-pay | Admitting: Neurology

## 2023-04-28 ENCOUNTER — Other Ambulatory Visit: Payer: Self-pay | Admitting: Neurology

## 2023-04-28 DIAGNOSIS — G40309 Generalized idiopathic epilepsy and epileptic syndromes, not intractable, without status epilepticus: Secondary | ICD-10-CM

## 2023-05-07 ENCOUNTER — Other Ambulatory Visit: Payer: Self-pay | Admitting: Neurology

## 2023-05-18 ENCOUNTER — Other Ambulatory Visit: Payer: Self-pay

## 2023-05-19 ENCOUNTER — Other Ambulatory Visit: Payer: Self-pay

## 2023-05-19 MED ORDER — CLINDAMYCIN PHOSPHATE 1 % EX SOLN
1.0000 | Freq: Two times a day (BID) | CUTANEOUS | 2 refills | Status: AC
  Filled 2023-05-19: qty 60, 30d supply, fill #0

## 2023-05-19 MED ORDER — SULFAMETHOXAZOLE-TRIMETHOPRIM 800-160 MG PO TABS
1.0000 | ORAL_TABLET | Freq: Two times a day (BID) | ORAL | 0 refills | Status: AC
  Filled 2023-05-19: qty 14, 7d supply, fill #0

## 2023-05-19 MED ORDER — MEDROXYPROGESTERONE ACETATE 150 MG/ML IM SUSY
PREFILLED_SYRINGE | INTRAMUSCULAR | 1 refills | Status: AC
  Filled 2023-05-19: qty 1, 90d supply, fill #0
  Filled 2023-11-16: qty 1, 90d supply, fill #1

## 2023-05-20 ENCOUNTER — Other Ambulatory Visit: Payer: Self-pay | Admitting: Nurse Practitioner

## 2023-05-20 DIAGNOSIS — Z9189 Other specified personal risk factors, not elsewhere classified: Secondary | ICD-10-CM

## 2023-05-20 DIAGNOSIS — N6320 Unspecified lump in the left breast, unspecified quadrant: Secondary | ICD-10-CM

## 2023-05-31 ENCOUNTER — Other Ambulatory Visit: Payer: Self-pay

## 2023-07-18 ENCOUNTER — Other Ambulatory Visit: Payer: Self-pay | Admitting: Neurology

## 2023-08-05 ENCOUNTER — Other Ambulatory Visit: Payer: Self-pay | Admitting: Neurology

## 2023-08-05 DIAGNOSIS — G40309 Generalized idiopathic epilepsy and epileptic syndromes, not intractable, without status epilepticus: Secondary | ICD-10-CM

## 2023-08-07 ENCOUNTER — Other Ambulatory Visit: Payer: Self-pay | Admitting: Neurology

## 2023-08-07 DIAGNOSIS — G40309 Generalized idiopathic epilepsy and epileptic syndromes, not intractable, without status epilepticus: Secondary | ICD-10-CM

## 2023-09-20 ENCOUNTER — Other Ambulatory Visit: Payer: Self-pay | Admitting: Neurology

## 2023-09-20 DIAGNOSIS — G40309 Generalized idiopathic epilepsy and epileptic syndromes, not intractable, without status epilepticus: Secondary | ICD-10-CM

## 2023-09-25 ENCOUNTER — Ambulatory Visit: Admission: EM | Admit: 2023-09-25 | Discharge: 2023-09-25 | Disposition: A | Discharge status: Home / Self Care

## 2023-09-25 DIAGNOSIS — R2242 Localized swelling, mass and lump, left lower limb: Secondary | ICD-10-CM

## 2023-09-25 NOTE — ED Provider Notes (Signed)
 [facility]    CSN: [id number] Arrival date & time: 09/25/23  1034      History   Chief Complaint Chief Complaint  Patient presents with   Leg Pain   Ankle Pain    HPI Stacy Mejia is a 43 y.o. female.   Patient presents with concerns of 2 areas of swelling to the left lower extremity.  Reports that over the past 2 weeks she developed a knot on the medial portion of the left leg directly below the knee.  Then, approximately 4 days later she developed swelling and discoloration to the left ankle.  She is not sure if these two are related.  Denies any injury to these two areas.  Denies history of DVT.  Denies numbness or tingling.  She reports it is very painful. She is not reporting any chest pain or shortness of breath.    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   Long term (current) use of opiate analgesic 07/10/2014   Depression 07/10/2014   Drug treatment not indicated 07/10/2014   Chronic use of opiate for therapeutic purpose 07/10/2014   Urinary urgency 07/10/2014   Posttraumatic stress disorder 06/15/2014   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   Chronic interstitial cystitis 08/12/2011   Dysuria 08/12/2011   Chronic migraine without aura 05/25/2011   Disorder of female genital organs 05/25/2011   Increased frequency of urination 05/25/2011   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/2011   Status post laparoscopic cholecystectomy 09/19/2010   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   OTHER SPEC DISEASES BLOOD&BLOOD-FORMING ORGANS 09/11/2009   Generalized anxiety disorder 09/11/2009    Tobacco user 09/11/2009   Erythema nodosum 09/11/2009   DIZZINESS 09/11/2009   PERSONAL HX OTH INFECTIOUS&PARASITIC DISEASE 09/11/2009   History of risk factor for suicide 09/11/2009    Past Surgical History:  Procedure Laterality Date   bladder stimulater     BLADDER SURGERY     CHOLECYSTECTOMY     I & D EXTREMITY Right 01/22/2018   Procedure: IRRIGATION AND DEBRIDEMENT OF FOREARM;  Surgeon: Dominica Severin, MD;  Location: [facility] OR;  Service: Orthopedics;  Laterality: Right;   I & D EXTREMITY Right 01/24/2018   Procedure: REPEAT IRRIGATION AND DEBRIDEMENT RIGHT FOREARM;  Surgeon: Dominica Severin, MD;  Location: [facility] OR;  Service: Orthopedics;  Laterality: Right;   Removal of stimulater from badder     TONSILLECTOMY      OB History   No obstetric history on file.      Home Medications    Prior to Admission medications   Medication Sig Start Date End Date Taking? Authorizing Provider  ALPRAZolam Prudy Feeler) 1 MG tablet Take 1 mg by mouth 2 (two) times daily as needed for anxiety. 08/22/15  Yes [provider]  lamoTRIgine (LAMICTAL) 200 MG tablet TAKE 1 TABLET BY MOUTH TWICE A DAY 08/10/23  Yes Van Clines, MD  propranolol (INDERAL) 40 MG tablet TAKE 1 TABLET BY MOUTH TWICE A DAY 07/19/23  Yes Van Clines, MD  aspirin-acetaminophen-caffeine [facility] MIGRAINE) [phone] MG tablet Take  2 tablets by mouth every 6 (six) hours as needed for headache.    [provider]  clindamycin (CLEOCIN T) 1 % external solution Apply 1 application Externally Twice a day as needed for 30 days 05/19/23     fexofenadine (ALLEGRA) 60 MG tablet Take 60 mg by mouth daily as needed for allergies.     [provider]  lidocaine (LIDODERM) 5 % Place 1 patch onto the skin daily. Remove & Discard patch within 12 hours or as directed by MD 12/31/22   Radford Pax, NP  medroxyPROGESTERone (DEPO-PROVERA) 150 MG/ML injection Inject 150 mg into the muscle every 3 (three) months.      [provider]  medroxyPROGESTERone Acetate 150 MG/ML SUSY Inject into the muscle. 01/25/23     medroxyPROGESTERone Acetate 150 MG/ML SUSY Inject 1mL intramuscular every 3 months for 90 days. 05/19/23   Lavonna Monarch, FNP  methocarbamol (ROBAXIN) 500 MG tablet Take 1 tablet (500 mg total) by mouth 2 (two) times daily. 09/04/20   Mare Ferrari, PA-C  pentosan polysulfate (ELMIRON) 100 MG capsule Take 100 mg by mouth daily as needed (bladder). 08/28/15   [provider]  pramipexole (MIRAPEX) 0.25 MG tablet Take 2 tablets 2 hours prior to bedtime 06/30/22   Van Clines, MD  promethazine (PHENERGAN) 25 MG tablet Take 25 mg by mouth every 6 (six) hours as needed.    [provider]  sertraline (ZOLOFT) 25 MG tablet TAKE 1 TABLET DAILY 11/17/22   Van Clines, MD  SUMAtriptan (IMITREX) 25 MG tablet Take 1 tablet at onset of migraine. Do not take more than 2-3 a week 06/30/22   Van Clines, MD  traZODone (DESYREL) 50 MG tablet Take 50 mg by mouth at bedtime.  10/14/17   [provider]    Family History Family History  Problem Relation Age of Onset   Hyperlipidemia Mother    Hypertension Mother    Hyperlipidemia Father    Hypertension Father    Diabetes Father    Heart disease Father    Depression Father    Seizures Brother 34       drug and alocohol use   Alcohol abuse Brother    Cancer Maternal Grandmother        colon   Cancer Paternal Grandmother        colon    Social History Social History   Tobacco Use   Smoking status: Every Day    Current packs/day: 1.00    Types: Cigarettes   Smokeless tobacco: Never  Vaping Use   Vaping status: Never Used  Substance Use Topics   Alcohol use: No    Alcohol/week: 0.0 standard drinks of alcohol   Drug use: No    Comment: "pills and heroin" previously     Allergies   Tequin [gatifloxacin], Adhesive [tape], and Latex   Review of Systems Review of Systems Per HPI  Physical  Exam Triage Vital Signs ED Triage Vitals  Encounter Vitals Group     BP 09/25/23 1052 (!) 132/92     Systolic BP Percentile --      Diastolic BP Percentile --      Pulse Rate 09/25/23 1052 76     Resp 09/25/23 1052 18     Temp 09/25/23 1052 97.8 F (36.6 C)     Temp Source 09/25/23 1052 Oral     SpO2 09/25/23 1052 96 %     Weight --  Height --      Head Circumference --      Peak Flow --      Pain Score 09/25/23 1049 10     Pain Loc --      Pain Education --      Exclude from Growth Chart --    No data found.  Updated Vital Signs BP (!) 132/92 (BP Location: Left Arm)   Pulse 76   Temp 97.8 F (36.6 C) (Oral)   Resp 18   LMP  (LMP Unknown)   SpO2 96%   Visual Acuity Right Eye Distance:   Left Eye Distance:   Bilateral Distance:    Right Eye Near:   Left Eye Near:    Bilateral Near:     Physical Exam Constitutional:      General: She is not in acute distress.    Appearance: Normal appearance. She is not toxic-appearing or diaphoretic.  HENT:     Head: Normocephalic and atraumatic.  Eyes:     Extraocular Movements: Extraocular movements intact.     Conjunctiva/sclera: Conjunctivae normal.  Pulmonary:     Effort: Pulmonary effort is normal.  Skin:    Comments: Patient has palpable, flesh-colored area of swelling present to medial portion of left leg directly below knee that is approximately 2 cm in diameter.  Lateral portion of left ankle is swollen and has bruising discoloration.  It is also tender to palpation.  Patient can wiggle toes and bear weight.  Capillary refill and pulses appear to be intact.  Neurological:     General: No focal deficit present.     Mental Status: She is alert and oriented to person, place, and time. Mental status is at baseline.  Psychiatric:        Mood and Affect: Mood normal.        Behavior: Behavior normal.        Thought Content: Thought content normal.        Judgment: Judgment normal.      UC Treatments / Results   Labs (all labs ordered are listed, but only abnormal results are displayed) Labs Reviewed - No data to display  EKG   Radiology No results found.  Procedures Procedures (including critical care time)  Medications Ordered in UC Medications - No data to display  Initial Impression / Assessment and Plan / UC Course  I have reviewed the triage vital signs and the nursing notes.  Pertinent labs & imaging results that were available during my care of the patient were reviewed by me and considered in my medical decision making (see chart for details).     Differential diagnoses include DVT versus large hematoma.  I do think patient needs prompt imaging and stat lab work which cannot be provided here at urgent care so patient was advised to go to the ER for further evaluation and management.  She was agreeable with plan.  Vital signs stable at discharge.  Agree with patient self transport to the ER. Final Clinical Impressions(s) / UC Diagnoses   Final diagnoses:  Localized swelling of left lower leg     Discharge Instructions      Please go to the emergency department as soon as you leave urgent care for further evaluation and management.    ED Prescriptions   None    PDMP not reviewed this encounter.   Gustavus Bryant, [place] 09/25/23 [phone]

## 2023-09-25 NOTE — ED Notes (Signed)
 Patient is being discharged from the Urgent Care and sent to the Emergency Department via POV . Per Ervin Knack, NP, patient is in need of higher level of care due to Evaluation of leg swelling, possible blood clot. Patient is aware and verbalizes understanding of plan of care.  Vitals:   09/25/23 1052  BP: (!) 132/92  Pulse: 76  Resp: 18  Temp: 97.8 F (36.6 C)  SpO2: 96%

## 2023-09-25 NOTE — ED Triage Notes (Addendum)
 Pt presents with swollen ankle and a knot in her left leg. Pt states the knot was there first. It started small and looked like a bruise. Within two weeks the knot doubled in size and became tender to touch. Pt states that 4 days ago the swelling in her left foot started. The foot is painful to touch and painful to stand on. Pt believes the knot in her leg and the swollen foot are related.   Pt denies an injury or fall.

## 2023-09-25 NOTE — Discharge Instructions (Signed)
Please go to the emergency department as soon as you leave urgent care for further evaluation and management. ?

## 2023-09-26 ENCOUNTER — Emergency Department (HOSPITAL_COMMUNITY)

## 2023-09-26 ENCOUNTER — Emergency Department (HOSPITAL_COMMUNITY)
Admission: EM | Admit: 2023-09-26 | Discharge: 2023-09-26 | Disposition: A | Discharge status: Home / Self Care | Attending: Emergency Medicine | Admitting: Emergency Medicine

## 2023-09-26 ENCOUNTER — Encounter (HOSPITAL_COMMUNITY): Payer: Self-pay | Admitting: Emergency Medicine

## 2023-09-26 ENCOUNTER — Other Ambulatory Visit: Payer: Self-pay

## 2023-09-26 ENCOUNTER — Emergency Department (HOSPITAL_BASED_OUTPATIENT_CLINIC_OR_DEPARTMENT_OTHER)

## 2023-09-26 DIAGNOSIS — M25572 Pain in left ankle and joints of left foot: Secondary | ICD-10-CM | POA: Diagnosis not present

## 2023-09-26 DIAGNOSIS — M7989 Other specified soft tissue disorders: Secondary | ICD-10-CM

## 2023-09-26 DIAGNOSIS — R2242 Localized swelling, mass and lump, left lower limb: Secondary | ICD-10-CM | POA: Diagnosis present

## 2023-09-26 LAB — BASIC METABOLIC PANEL
Anion gap: 9 (ref 5–15)
BUN: 11 mg/dL (ref 6–20)
CO2: 22 mmol/L (ref 22–32)
Calcium: 9.1 mg/dL (ref 8.9–10.3)
Chloride: 107 mmol/L (ref 98–111)
Creatinine, Ser: 0.78 mg/dL (ref 0.44–1.00)
GFR, Estimated: >60 mL/min (ref >60)
Glucose, Bld: 111 mg/dL — ABNORMAL HIGH (ref 70–99)
Potassium: 3.4 mmol/L — ABNORMAL LOW (ref 3.5–5.1)
Sodium: 138 mmol/L (ref 135–145)

## 2023-09-26 LAB — CBC WITH DIFFERENTIAL/PLATELET
Abs Immature Granulocytes: 0.01 10*3/uL (ref 0.00–0.07)
Basophils Absolute: 0 10*3/uL (ref 0.0–0.1)
Basophils Relative: 1 %
Eosinophils Absolute: 0.1 10*3/uL (ref 0.0–0.5)
Eosinophils Relative: 2 %
HCT: 39.3 % (ref 36.0–46.0)
Hemoglobin: 13.4 g/dL (ref 12.0–15.0)
Immature Granulocytes: 0 %
Lymphocytes Relative: 45 %
Lymphs Abs: 3.4 10*3/uL (ref 0.7–4.0)
MCH: 33.4 pg (ref 26.0–34.0)
MCHC: 34.1 g/dL (ref 30.0–36.0)
MCV: 98 fL (ref 80.0–100.0)
Monocytes Absolute: 0.5 10*3/uL (ref 0.1–1.0)
Monocytes Relative: 6 %
Neutro Abs: 3.6 10*3/uL (ref 1.7–7.7)
Neutrophils Relative %: 46 %
Platelets: 274 10*3/uL (ref 150–400)
RBC: 4.01 MIL/uL (ref 3.87–5.11)
RDW: 12.1 % (ref 11.5–15.5)
WBC: 7.6 10*3/uL (ref 4.0–10.5)
nRBC: 0 % (ref 0.0–0.2)

## 2023-09-26 MED ORDER — KETOROLAC TROMETHAMINE 15 MG/ML IJ SOLN
15.0000 mg | Freq: Once | INTRAMUSCULAR | Status: AC
  Administered 2023-09-26: 15 mg via INTRAMUSCULAR
  Filled 2023-09-26: qty 1

## 2023-09-26 MED ORDER — ACETAMINOPHEN 500 MG PO TABS
1000.0000 mg | ORAL_TABLET | Freq: Once | ORAL | Status: AC
  Administered 2023-09-26: 1000 mg via ORAL
  Filled 2023-09-26: qty 2

## 2023-09-26 MED ORDER — KETOROLAC TROMETHAMINE 15 MG/ML IJ SOLN
15.0000 mg | Freq: Once | INTRAMUSCULAR | Status: DC
Start: 2023-09-26 — End: 2023-09-26

## 2023-09-26 MED ORDER — CELECOXIB 200 MG PO CAPS: 200.0000 mg | ORAL_CAPSULE | Freq: Two times a day (BID) | ORAL | 0 refills | Status: AC

## 2023-09-26 NOTE — Progress Notes (Signed)
 Lower extremity venous duplex completed. Please see CV Procedures for preliminary results.  Shona Simpson, RVT 09/26/23 4:39 PM

## 2023-09-26 NOTE — ED Triage Notes (Signed)
 Pt presents with swollen left ankle and leg. Pt went to urgent care yesterday. Pt states she has not fallen or twisted it. Pt states she also had a knot that formed first before the leg swelling. Pt states swelling started 5 days ago in left leg and foot. Foot is painful to touch and painful to stand on.

## 2023-09-26 NOTE — ED Provider Notes (Signed)
 [place] [facility] AT [facility] Provider Note   CSN: [id number] Arrival date & time: 09/26/23  1345     History Chief Complaint  Patient presents with   Leg Swelling    HPI CHIEKO NEISES is a 43 y.o. female presenting for left foot pain and swelling. It is nontraumatic in nature, hurts along the lateral aspect of her left foot.  Hurts to bear weight.  Denies fevers chills nausea vomiting shortness of breath.  No history of similar.  Large bruise over the site.  Patient has no recollection of any events of trauma.  Patient's recorded medical, surgical, social, medication list and allergies were reviewed in the Snapshot window as part of the initial history.   Review of Systems   Review of Systems  Constitutional:  Negative for chills and fever.  HENT:  Negative for ear pain and sore throat.   Eyes:  Negative for pain and visual disturbance.  Respiratory:  Negative for cough and shortness of breath.   Cardiovascular:  Negative for chest pain and palpitations.  Gastrointestinal:  Negative for abdominal pain and vomiting.  Genitourinary:  Negative for dysuria and hematuria.  Musculoskeletal:  Negative for arthralgias and back pain.  Skin:  Negative for color change and rash.  Neurological:  Negative for seizures and syncope.  All other systems reviewed and are negative.   Physical Exam Updated Vital Signs BP 124/75   Pulse 66   Temp 98 F (36.7 C) (Oral)   Resp 18   Ht 5\' 2"  (1.575 m)   Wt 67.1 kg   LMP  (LMP Unknown)   SpO2 95%   BMI 27.07 kg/m  Physical Exam Vitals and nursing note reviewed.  Constitutional:      General: She is not in acute distress.    Appearance: She is well-developed.  HENT:     Head: Normocephalic and atraumatic.  Eyes:     Conjunctiva/sclera: Conjunctivae normal.  Cardiovascular:     Rate and Rhythm: Normal rate and regular rhythm.     Heart sounds: No murmur heard. Pulmonary:     Effort: Pulmonary effort is  normal. No respiratory distress.     Breath sounds: Normal breath sounds.  Abdominal:     General: There is no distension.     Palpations: Abdomen is soft.     Tenderness: There is no abdominal tenderness. There is no right CVA tenderness or left CVA tenderness.  Musculoskeletal:        General: Tenderness (Tenderness and bruising over the left lateral malleolus.) and signs of injury present. No swelling. Normal range of motion.     Cervical back: Neck supple.  Skin:    General: Skin is warm and dry.  Neurological:     General: No focal deficit present.     Mental Status: She is alert and oriented to person, place, and time. Mental status is at baseline.     Cranial Nerves: No cranial nerve deficit.      ED Course/ Medical Decision Making/ A&P    Procedures Procedures   Medications Ordered in ED Medications  ketorolac (TORADOL) 15 MG/ML injection 15 mg (has no administration in time range)  acetaminophen (TYLENOL) tablet 1,000 mg (1,000 mg Oral Given 09/26/23 1526)    Medical Decision Making:    ALISSE TUITE is a 43 y.o. female who presented to the ED today with left ankle pain detailed above.     Complete initial physical exam performed, notably the  patient  was clinically stable no acute distress.      Reviewed and confirmed nursing documentation for past medical history, family history, social history.    Initial Assessment:   With the patient's presentation of left ankle pain, most likely diagnosis is musculoskeletal etiology including strain or sprain. Other diagnoses were considered including (but not limited to) hematoma, DVT, orthopedic fracture, septic arthritis. These are considered less likely due to history of present illness and physical exam findings.   This is most consistent with an acute life/limb threatening illness complicated by underlying chronic conditions.  Initial Plan:  Left ankle x-ray DVT study left lower extremity Screening labs including CBC  and Metabolic panel to evaluate for infectious or metabolic etiology of disease.  Objective evaluation as below reviewed with plan for close reassessment  Initial Study Results:   Laboratory  All laboratory results reviewed without evidence of clinically relevant pathology.   Radiology  All images reviewed independently. Agree with radiology report at this time.   VAS [place] LOWER EXTREMITY VENOUS (DVT) (ONLY [facility] & [facility]) Result Date: 09/26/2023  Lower Venous DVT Study Patient Name:  SHEYANNE MUNLEY  Date of Exam:   09/26/2023 Medical Rec #: [id number]       Accession #:    [id number] Date of Birth: 1981-03-21      Patient Gender: F Patient Age:   70 years Exam Location:  [facility] Procedure:      VAS [place] LOWER EXTREMITY VENOUS (DVT) Referring Phys: Almeta Monas Brinn Westby --------------------------------------------------------------------------------  Indications: Pain.  Risk Factors: None identified. Comparison Study: None. Performing Technologist: Shona Simpson  Examination Guidelines: A complete evaluation includes B-mode imaging, spectral Doppler, color Doppler, and power Doppler as needed of all accessible portions of each vessel. Bilateral testing is considered an integral part of a complete examination. Limited examinations for reoccurring indications may be performed as noted. The reflux portion of the exam is performed with the patient in reverse Trendelenburg.  +-----+---------------+---------+-----------+----------+--------------+ RIGHTCompressibilityPhasicitySpontaneityPropertiesThrombus Aging +-----+---------------+---------+-----------+----------+--------------+ CFV  Full           Yes      Yes                                 +-----+---------------+---------+-----------+----------+--------------+   +---------+---------------+---------+-----------+----------+--------------+ LEFT     CompressibilityPhasicitySpontaneityPropertiesThrombus Aging  +---------+---------------+---------+-----------+----------+--------------+ CFV      Full           Yes      Yes                                 +---------+---------------+---------+-----------+----------+--------------+ SFJ      Full                                                        +---------+---------------+---------+-----------+----------+--------------+ FV Prox  Full                                                        +---------+---------------+---------+-----------+----------+--------------+ FV Mid   Full                                                        +---------+---------------+---------+-----------+----------+--------------+  FV DistalFull                                                        +---------+---------------+---------+-----------+----------+--------------+ PFV      Full                                                        +---------+---------------+---------+-----------+----------+--------------+ POP      Full           Yes      Yes                                 +---------+---------------+---------+-----------+----------+--------------+ PTV      Full                                                        +---------+---------------+---------+-----------+----------+--------------+ PERO     Full                                                        +---------+---------------+---------+-----------+----------+--------------+    Summary: RIGHT: - No evidence of common femoral vein obstruction.   LEFT: - There is no evidence of deep vein thrombosis in the lower extremity.  - No cystic structure found in the popliteal fossa.  *See table(s) above for measurements and observations. Electronically signed by Lemar Livings MD on 09/26/2023 at 5:41:12 PM.    Final    DG Ankle Complete Left Result Date: 09/26/2023 CLINICAL DATA:  Left ankle swelling.  No known injury. EXAM: LEFT ANKLE COMPLETE - 3 VIEW COMPARISON:  None  Available. FINDINGS: There are no findings of fracture or dislocation. Small joint effusion. There is no evidence of arthropathy or other focal bone abnormality. Ankle mortise is intact. Soft tissue swelling over the lateral malleolus. IMPRESSION: Small joint effusion and soft tissue swelling over the lateral malleolus. No acute fracture or dislocation. Electronically Signed   By: Agustin Cree M.D.   On: 09/26/2023 16:20    On reassessment there is no focal pathology. Will treat patient with NSAIDs  Reassessment and Plan:   Due to the localized swelling for likely musculoskeletal etiology recommend follow-up with orthopedics in 5 days if still symptomatic.  Strict return precautions reinforced.  Clinically inconsistent with septic arthritis based on current presentation.  Discussed clinical overlap.   Disposition:  I have considered need for hospitalization, however, considering all of the above, I believe this patient is stable for discharge at this time.  Patient/family educated about specific return precautions for given chief complaint and symptoms.  Patient/family educated about follow-up with PCP.     Patient/family expressed understanding of return precautions and need for follow-up. Patient spoken to regarding all imaging and laboratory results and appropriate follow up for these results. All education  provided in verbal form with additional information in written form. Time was allowed for answering of patient questions. Patient discharged.    Emergency Department Medication Summary:   Medications  ketorolac (TORADOL) 15 MG/ML injection 15 mg (has no administration in time range)  acetaminophen (TYLENOL) tablet 1,000 mg (1,000 mg Oral Given 09/26/23 1526)         Clinical Impression:  1. Acute left ankle pain      Discharge   Final Clinical Impression(s) / ED Diagnoses Final diagnoses:  Acute left ankle pain    Rx / DC Orders ED Discharge Orders          Ordered     celecoxib (CELEBREX) 200 MG capsule  2 times daily        09/26/23 1742              Glyn Ade, MD 09/26/23 1742

## 2023-09-27 ENCOUNTER — Other Ambulatory Visit: Payer: Self-pay

## 2023-11-16 ENCOUNTER — Other Ambulatory Visit: Payer: Self-pay

## 2023-12-24 ENCOUNTER — Other Ambulatory Visit: Payer: Self-pay | Admitting: Neurology

## 2023-12-24 DIAGNOSIS — G40309 Generalized idiopathic epilepsy and epileptic syndromes, not intractable, without status epilepticus: Secondary | ICD-10-CM

## 2024-01-06 ENCOUNTER — Other Ambulatory Visit: Payer: BC Managed Care – PPO

## 2024-01-19 ENCOUNTER — Other Ambulatory Visit

## 2024-02-03 ENCOUNTER — Other Ambulatory Visit: Payer: Self-pay | Admitting: Neurology

## 2024-03-01 ENCOUNTER — Encounter: Payer: Self-pay | Admitting: Neurology

## 2024-03-01 ENCOUNTER — Ambulatory Visit: Payer: BC Managed Care – PPO | Admitting: Neurology

## 2024-03-23 ENCOUNTER — Other Ambulatory Visit: Payer: Self-pay | Admitting: Neurology

## 2024-03-23 DIAGNOSIS — G40309 Generalized idiopathic epilepsy and epileptic syndromes, not intractable, without status epilepticus: Secondary | ICD-10-CM

## 2024-05-17 ENCOUNTER — Encounter

## 2024-05-17 ENCOUNTER — Ambulatory Visit (HOSPITAL_BASED_OUTPATIENT_CLINIC_OR_DEPARTMENT_OTHER)

## 2024-05-18 ENCOUNTER — Encounter: Payer: Self-pay | Admitting: Neurology

## 2024-05-22 ENCOUNTER — Telehealth: Payer: Self-pay | Admitting: Neurology

## 2024-05-22 NOTE — Telephone Encounter (Signed)
Pt has questions about Rx

## 2024-05-22 NOTE — Telephone Encounter (Signed)
 Pt asked who ordered her trazadone in 2019

## 2024-06-12 NOTE — Telephone Encounter (Signed)
 Patient Connection Specialist:  Please follow the instructions below Attempt # 1, 2  Phone # [phone]  Message Attempted to contact patient to re-schedule appointment on 06/20/2024 with Dr.Priyambada. Dr.Priyambada is not accepting medicaid patient's at this time. Patient will need to be re-scheduled with a different provider. Patient has been re-scheduled for 07/05/2024 at 1:20 pm with Arnaz Siganporia. If this day and time does not work for the patient, please re-schedule. Thank you!

## 2024-07-14 ENCOUNTER — Other Ambulatory Visit (HOSPITAL_BASED_OUTPATIENT_CLINIC_OR_DEPARTMENT_OTHER)

## 2024-09-27 ENCOUNTER — Ambulatory Visit: Admitting: Neurology
